# Patient Record
Sex: Male | Born: 1955
Health system: Southern US, Community
[De-identification: ages and names within clinical notes are randomized; demographics above are authoritative.]

## PROBLEM LIST (undated history)

## (undated) DIAGNOSIS — E0500 Thyrotoxicosis with diffuse goiter without thyrotoxic crisis or storm: Secondary | ICD-10-CM

## (undated) DIAGNOSIS — M25519 Pain in unspecified shoulder: Secondary | ICD-10-CM

## (undated) DIAGNOSIS — M722 Plantar fascial fibromatosis: Secondary | ICD-10-CM

## (undated) DIAGNOSIS — M1990 Unspecified osteoarthritis, unspecified site: Secondary | ICD-10-CM

## (undated) DIAGNOSIS — E039 Hypothyroidism, unspecified: Secondary | ICD-10-CM

## (undated) DIAGNOSIS — G4733 Obstructive sleep apnea (adult) (pediatric): Secondary | ICD-10-CM

## (undated) DIAGNOSIS — G4700 Insomnia, unspecified: Secondary | ICD-10-CM

## (undated) DIAGNOSIS — B351 Tinea unguium: Secondary | ICD-10-CM

## (undated) DIAGNOSIS — R7309 Other abnormal glucose: Secondary | ICD-10-CM

## (undated) DIAGNOSIS — J309 Allergic rhinitis, unspecified: Secondary | ICD-10-CM

## (undated) DIAGNOSIS — M549 Dorsalgia, unspecified: Secondary | ICD-10-CM

## (undated) HISTORY — PX: TONSILLECTOMY: SUR1361

## (undated) HISTORY — PX: KNEE ARTHROSCOPY: SUR90

## (undated) HISTORY — DX: Plantar fascial fibromatosis: M72.2

## (undated) HISTORY — PX: CYST REMOVAL LEG: SHX6280

## (undated) HISTORY — DX: Pain in unspecified shoulder: M25.519

## (undated) HISTORY — DX: Hypothyroidism, unspecified: E03.9

## (undated) HISTORY — DX: Other abnormal glucose: R73.09

## (undated) HISTORY — DX: Obstructive sleep apnea (adult) (pediatric): G47.33

## (undated) HISTORY — DX: Tinea unguium: B35.1

## (undated) HISTORY — DX: Allergic rhinitis, unspecified: J30.9

## (undated) HISTORY — DX: Dorsalgia, unspecified: M54.9

## (undated) HISTORY — DX: Insomnia, unspecified: G47.00

## (undated) HISTORY — DX: Thyrotoxicosis with diffuse goiter without thyrotoxic crisis or storm: E05.00

## (undated) HISTORY — PX: HIP ARTHROPLASTY: SHX981

---

## 2000-07-29 ENCOUNTER — Ambulatory Visit (HOSPITAL_COMMUNITY): Admission: RE | Admit: 2000-07-29 | Discharge: 2000-07-29 | Payer: Self-pay | Admitting: Gastroenterology

## 2007-02-17 ENCOUNTER — Encounter: Payer: Self-pay | Admitting: Internal Medicine

## 2007-05-16 ENCOUNTER — Encounter: Admission: RE | Admit: 2007-05-16 | Discharge: 2007-05-16 | Payer: Self-pay | Admitting: Family Medicine

## 2008-05-08 ENCOUNTER — Ambulatory Visit: Payer: Self-pay | Admitting: Internal Medicine

## 2008-05-08 DIAGNOSIS — E0500 Thyrotoxicosis with diffuse goiter without thyrotoxic crisis or storm: Secondary | ICD-10-CM | POA: Insufficient documentation

## 2008-05-08 DIAGNOSIS — B351 Tinea unguium: Secondary | ICD-10-CM | POA: Insufficient documentation

## 2008-05-08 DIAGNOSIS — M549 Dorsalgia, unspecified: Secondary | ICD-10-CM | POA: Insufficient documentation

## 2008-05-08 DIAGNOSIS — G4733 Obstructive sleep apnea (adult) (pediatric): Secondary | ICD-10-CM | POA: Insufficient documentation

## 2008-05-08 DIAGNOSIS — M722 Plantar fascial fibromatosis: Secondary | ICD-10-CM

## 2008-05-08 HISTORY — DX: Dorsalgia, unspecified: M54.9

## 2008-05-08 HISTORY — DX: Thyrotoxicosis with diffuse goiter without thyrotoxic crisis or storm: E05.00

## 2008-05-08 HISTORY — DX: Plantar fascial fibromatosis: M72.2

## 2008-05-08 HISTORY — DX: Obstructive sleep apnea (adult) (pediatric): G47.33

## 2008-05-08 HISTORY — DX: Tinea unguium: B35.1

## 2008-05-12 DIAGNOSIS — J309 Allergic rhinitis, unspecified: Secondary | ICD-10-CM

## 2008-05-12 HISTORY — DX: Allergic rhinitis, unspecified: J30.9

## 2008-05-29 ENCOUNTER — Ambulatory Visit: Payer: Self-pay | Admitting: Internal Medicine

## 2008-07-08 ENCOUNTER — Encounter: Payer: Self-pay | Admitting: Internal Medicine

## 2008-07-20 ENCOUNTER — Ambulatory Visit: Payer: Self-pay | Admitting: Internal Medicine

## 2009-01-21 ENCOUNTER — Telehealth (INDEPENDENT_AMBULATORY_CARE_PROVIDER_SITE_OTHER): Payer: Self-pay | Admitting: *Deleted

## 2009-04-22 ENCOUNTER — Telehealth: Payer: Self-pay | Admitting: *Deleted

## 2009-08-07 ENCOUNTER — Telehealth: Payer: Self-pay | Admitting: Family Medicine

## 2009-10-10 ENCOUNTER — Ambulatory Visit: Payer: Self-pay | Admitting: Family Medicine

## 2009-10-10 DIAGNOSIS — E039 Hypothyroidism, unspecified: Secondary | ICD-10-CM | POA: Insufficient documentation

## 2009-10-10 DIAGNOSIS — M25519 Pain in unspecified shoulder: Secondary | ICD-10-CM | POA: Insufficient documentation

## 2009-10-10 HISTORY — DX: Hypothyroidism, unspecified: E03.9

## 2009-10-10 HISTORY — DX: Pain in unspecified shoulder: M25.519

## 2009-10-14 LAB — CONVERTED CEMR LAB: TSH: 3.64 microintl units/mL (ref 0.35–5.50)

## 2009-10-31 ENCOUNTER — Ambulatory Visit: Payer: Self-pay | Admitting: Family Medicine

## 2010-04-16 ENCOUNTER — Ambulatory Visit: Payer: Self-pay | Admitting: Family Medicine

## 2010-04-16 LAB — CONVERTED CEMR LAB
ALT: 20 units/L (ref 0–53)
AST: 17 units/L (ref 0–37)
Albumin: 4.4 g/dL (ref 3.5–5.2)
Alkaline Phosphatase: 56 units/L (ref 39–117)
BUN: 15 mg/dL (ref 6–23)
Basophils Absolute: 0 10*3/uL (ref 0.0–0.1)
Basophils Relative: 0.5 % (ref 0.0–3.0)
Bilirubin Urine: NEGATIVE
Bilirubin, Direct: 0.2 mg/dL (ref 0.0–0.3)
Blood in Urine, dipstick: NEGATIVE
CO2: 30 meq/L (ref 19–32)
Calcium: 9 mg/dL (ref 8.4–10.5)
Chloride: 103 meq/L (ref 96–112)
Cholesterol: 160 mg/dL (ref 0–200)
Creatinine, Ser: 0.8 mg/dL (ref 0.4–1.5)
Eosinophils Absolute: 0.1 10*3/uL (ref 0.0–0.7)
Eosinophils Relative: 1.9 % (ref 0.0–5.0)
GFR calc non Af Amer: 116.21 mL/min (ref >60)
Glucose, Bld: 115 mg/dL — ABNORMAL HIGH (ref 70–99)
Glucose, Urine, Semiquant: NEGATIVE
HCT: 41.7 % (ref 39.0–52.0)
HDL: 41.9 mg/dL (ref >39.00)
Hemoglobin: 14.5 g/dL (ref 13.0–17.0)
Ketones, urine, test strip: NEGATIVE
LDL Cholesterol: 98 mg/dL (ref 0–99)
Lymphocytes Relative: 30.1 % (ref 12.0–46.0)
Lymphs Abs: 2.2 10*3/uL (ref 0.7–4.0)
MCHC: 34.7 g/dL (ref 30.0–36.0)
MCV: 90.2 fL (ref 78.0–100.0)
Monocytes Absolute: 0.6 10*3/uL (ref 0.1–1.0)
Monocytes Relative: 8.4 % (ref 3.0–12.0)
Neutro Abs: 4.3 10*3/uL (ref 1.4–7.7)
Neutrophils Relative %: 59.1 % (ref 43.0–77.0)
Nitrite: NEGATIVE
PSA: 0.21 ng/mL (ref 0.10–4.00)
Platelets: 282 10*3/uL (ref 150.0–400.0)
Potassium: 4.1 meq/L (ref 3.5–5.1)
Protein, U semiquant: NEGATIVE
RBC: 4.62 M/uL (ref 4.22–5.81)
RDW: 13.1 % (ref 11.5–14.6)
Sodium: 138 meq/L (ref 135–145)
Specific Gravity, Urine: 1.02
TSH: 2.21 microintl units/mL (ref 0.35–5.50)
Total Bilirubin: 0.9 mg/dL (ref 0.3–1.2)
Total CHOL/HDL Ratio: 4
Total Protein: 6.7 g/dL (ref 6.0–8.3)
Triglycerides: 100 mg/dL (ref 0.0–149.0)
Urobilinogen, UA: 0.2
VLDL: 20 mg/dL (ref 0.0–40.0)
WBC Urine, dipstick: NEGATIVE
WBC: 7.4 10*3/uL (ref 4.5–10.5)
pH: 7.5

## 2010-04-25 ENCOUNTER — Ambulatory Visit: Payer: Self-pay | Admitting: Family Medicine

## 2010-06-17 ENCOUNTER — Telehealth: Payer: Self-pay | Admitting: Family Medicine

## 2010-06-17 ENCOUNTER — Ambulatory Visit: Payer: Self-pay | Admitting: Family Medicine

## 2010-06-17 DIAGNOSIS — G4700 Insomnia, unspecified: Secondary | ICD-10-CM | POA: Insufficient documentation

## 2010-06-17 HISTORY — DX: Insomnia, unspecified: G47.00

## 2010-08-19 ENCOUNTER — Ambulatory Visit: Payer: Self-pay | Admitting: Family Medicine

## 2010-09-18 ENCOUNTER — Ambulatory Visit: Payer: Self-pay | Admitting: Family Medicine

## 2010-09-18 DIAGNOSIS — R7309 Other abnormal glucose: Secondary | ICD-10-CM

## 2010-09-18 HISTORY — DX: Other abnormal glucose: R73.09

## 2010-09-18 LAB — CONVERTED CEMR LAB: Blood Glucose, Fingerstick: 124

## 2010-11-06 NOTE — Assessment & Plan Note (Signed)
Summary: cpx//ccm   Vital Signs:  Patient profile:   55 year old male Height:      73.25 inches Weight:      283 pounds Temp:     98.7 degrees F oral Pulse rate:   60 / minute Pulse rhythm:   regular Resp:     12 per minute BP sitting:   110 / 72  (left arm) Cuff size:   large  Vitals Entered By: Nira Conn LPN (April 25, 3661 9:47 PM)  History of Present Illness: Here for complete physical examination  Patient has history of hypothyroidism and obstructive sleep apnea.  Past medical history, social history, and family history reviewed and no significant changes. He had colonoscopy at age 41 after some rectal bleeding. He thinks some polyps were noted but is not sure. Last tetanus unknown. No regular exercise.  Allergies (verified): No Known Drug Allergies  Past History:  Past Medical History: Last updated: 07/20/2008 chickenpox Allergic rhinitis Graves' disease I131 rx    on hypothyroid replacement obstructive sleep apnea -  NPSG @ [facility] 02/17/07 AHI 18.2, cpap to 9  Past Surgical History: Last updated: 10/10/2009  Tonsillectomy ( as a young boy)  Family History: Last updated: 05/08/2008 Raised in a foster family Family History of Stroke M 1st degree relative <50- Father Stroke age 57  Social History: Last updated: 05/08/2008 Married Former Smoker Alcohol use-yes Drug use-no Regular exercise-yes hh of 4   pet cockateel  6 hours sleep   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  Occupation:  Risk Factors: Alcohol Use: <1 (05/08/2008) Exercise: yes (05/08/2008)  Risk Factors: Smoking Status: quit > 6 months (10/10/2009) PMH-FH-SH reviewed for relevance  Review of Systems  The patient denies anorexia, fever, weight loss, weight gain, vision loss, decreased hearing, hoarseness, chest pain, syncope, dyspnea on exertion, peripheral edema, prolonged cough, headaches, hemoptysis, abdominal pain, melena, hematochezia, severe indigestion/heartburn, hematuria,  incontinence, genital sores, muscle weakness, suspicious skin lesions, transient blindness, difficulty walking, depression, unusual weight change, abnormal bleeding, enlarged lymph nodes, and testicular masses.         bilateral knee pain and stiffness which has gone on for years and somewhat progressive  Physical Exam  General:  Well-developed,well-nourished,in no acute distress; alert,appropriate and cooperative throughout examination Head:  Normocephalic and atraumatic without obvious abnormalities. No apparent alopecia or balding. Eyes:  pupils equal, pupils round, and pupils reactive to light.   Ears:  External ear exam shows no significant lesions or deformities.  Otoscopic examination reveals clear canals, tympanic membranes are intact bilaterally without bulging, retraction, inflammation or discharge. Hearing is grossly normal bilaterally. Mouth:  Oral mucosa and oropharynx without lesions or exudates.  Teeth in good repair. Neck:  No deformities, masses, or tenderness noted. Lungs:  Normal respiratory effort, chest expands symmetrically. Lungs are clear to auscultation, no crackles or wheezes. Heart:  Normal rate and regular rhythm. S1 and S2 normal without gallop, murmur, click, rub or other extra sounds. Abdomen:  Bowel sounds positive,abdomen soft and non-tender without masses, organomegaly or hernias noted. Rectal:  No external abnormalities noted. Normal sphincter tone. No rectal masses or tenderness. Prostate:  Prostate gland firm and smooth, no enlargement, nodularity, tenderness, mass, asymmetry or induration. Msk:  No deformity or scoliosis noted of thoracic or lumbar spine.   Extremities:  significant crepitus with flexion and extension of both knees. No knee effusion Neurologic:  alert & oriented X3, cranial nerves II-XII intact, strength normal in all extremities, and gait normal.   Skin:  Intact without suspicious lesions or rashes Cervical Nodes:  No lymphadenopathy  noted Psych:  Oriented X3, normally interactive, good eye contact, not anxious appearing, and not depressed appearing.     Impression & Recommendations:  Problem # 1:  Preventive Health Care (ICD-V70.0) Tdap given.  Labs reviewed and significant for glucose 115. Discussed diet and exercise at length. Needs to lose weight and establish more regular exercise. Dietary information given on reducing carbohydrate use. Recheck fasting blood sugar 3 months. Patient will check to see when next colonoscopy due  Complete Medication List: 1)  Synthroid 112 Mcg Tabs (Levothyroxine sodium) .Marland Kitchen.. 1 by mouth once daily 2)  Clonazepam 0.5 Mg Tabs (Clonazepam) .Marland Kitchen.. 1 before sleep as needed 3)  Cpap 9  4)  Cyclobenzaprine Hcl 10 Mg Tabs (Cyclobenzaprine hcl) .... One by mouth q 8 hours as needed muscle spasm 5)  Zyrtec Hives Relief 10 Mg Tabs (Cetirizine hcl) .... Once daily as needed  Other Orders: Tdap => 20yr IM (([id number] Admin 1st Vaccine ((60454  Patient Instructions: 1)  Please schedule a follow-up appointment in 3 months-for fasting glucose. 2)  It is important that you exercise reguarly at least 20 minutes 5 times a week. If you develop chest pain, have severe difficulty breathing, or feel very tired, stop exercising immediately and seek medical attention.  3)  You need to lose weight. Consider a lower calorie diet and regular exercise.  4)  Check to see who performed your last colonoscopy    Immunizations Administered:  Tetanus Vaccine:    Vaccine Type: Tdap    Site: left deltoid    Mfr: GlaxoSmithKline    Dose: 0.5 ml    Route: IM    Given by: NNira ConnLPN    Exp. Date: 10/24/2011    Lot #: [id number]

## 2010-11-06 NOTE — Assessment & Plan Note (Signed)
Summary: follow up/ mbw   PCP:  Tollie Pizza  Chief Complaint:  1 month followup.  Followup cpap titration study.  No new complaints.  Would like a flu vax.Marland Kitchen  History of Present Illness: From 05/29/08- 55 year old man with sleep apnea, coming to establish for help managing this condition.  diagnostic study done in the cardiology lab on 02/17/2007 recording an index of 18.2/hr.  CPAP was titrated to 9 CWP.  His wife tells him that it helps.  He still finds he only sleeps 5 hours maximum.  He fights daytime sleepiness.  Complains of busy brain at night.  He admits there is probably less daytime sleepiness, when he wears CPAP.  Weight has not changed. Bedtime.  11 p.m.Marland Kitchen  Sleep latency 30 to 60 minutes.  Waking 4 times during the night before he gets up at 5:30 a.m..  07/20/08- Autotitration confirms good control with cpap 9-10. Likes nasal pillows, less WASO. Liked clonazepam which lasts longer. Nose ok. Wife says he sleeps peacefully and qwuietly. Less EDS.      Current Allergies: No known allergies   Past Medical History:    Reviewed history from 05/29/2008 and no changes required:       chickenpox       Allergic rhinitis       Graves' disease I131 rx    on hypothyroid replacement       obstructive sleep apnea -  NPSG @ [facility] 02/17/07 AHI 18.2, cpap to 9  Past Surgical History:    Reviewed history from 05/29/2008 and no changes required:              Tonsillectomy   Social History:    Reviewed history from 05/08/2008 and no changes required:       Married       Former Smoker       Alcohol use-yes       Drug use-no       Regular exercise-yes       hh of 4         pet cockateel        6 hours sleep         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        Occupation:    Review of Systems      See HPI   Vital Signs:  Patient Profile:   55 Years Old Male Height:     73.5 inches Weight:      280 pounds O2 Sat:      99 % O2 treatment:    Room Air Pulse rate:   79 / minute BP  sitting:   116 / 78  Vitals Entered ByTilden Dome (July 20, 2008 2:53 PM)                 Physical Exam  General: A/Ox3; pleasant and cooperative, NAD, overweight, alert SKIN: no rash, lesions NODES: no lymphadenopathy HEENT: [place]/AT, EOM- WNL, Conjuctivae- clear, PERRLA, TM-WNL, Nose- clear, Throat- clear and wnl., Melampatti II-III NECK: Supple w/ fair ROM, JVD- none, normal carotid impulses w/o bruits Thyroid- normal to palpation CHEST: Clear to P&A HEART: RRR, no m/g/r heard ABDOMEN: Soft and nl; nml bowel sounds; no organomegaly or masses noted XBD:ZHGD, nl pulses, no edema  NEURO: Grossly intact to observation         Impression & Recommendations:  Problem # 1:  OBSTRUCTIVE SLEEP APNEA (ICD-327.23) Doing better. Reinforced sleep hygiene teaching, discussed clonazepam. Leave cpap at 9.  Flu vax.Cool bedroom.  Other Orders: Admin 1st Vaccine [phone]) Flu Vaccine 29yr + (([id number]   Patient Instructions: 1)  Please schedule a follow-up appointment in 6 months. 2)  Clonazepam script printed for occasional use as needed 3)  Flu vax 4)  I will look at the download when available and consider trying a pressure change if appropriate.   .Marland Kitchenpcflu  Flu Vaccine Consent Questions     Do you have a history of severe allergic reactions to this vaccine? no    Any prior history of allergic reactions to egg and/or gelatin? no    Do you have a sensitivity to the preservative Thimersol? no    Do you have a past history of Guillan-Barre Syndrome? no    Do you currently have an acute febrile illness? no    Have you ever had a severe reaction to latex? no    Vaccine information given and explained to patient? yes    Are you currently pregnant? no    Lot Number:[id number]   Site Given  Left Deltoid IM JParke PoissonCNA  July 20, 2008 3:31 PM  Prescriptions: CLONAZEPAM 0.5 MG TABS (CLONAZEPAM) 1 before sleep as needed  #30 x prn   Entered and Authorized by:   CDeneise LeverMD   Signed by:   CDeneise LeverMD on 07/20/2008   Method used:   Print then Give to Patient   RxID:   [id number] ]

## 2010-11-06 NOTE — Progress Notes (Signed)
Summary: Clonazepam refill  Phone Note Call from Patient Call back at Home Phone [phone]   Caller: Spouse Call For: young Summary of Call: requests rx for clonazepam (expired). [facility] [place]  Initial call taken by: Cooper Render,  January 21, 2009 12:06 PM  Follow-up for Phone Call        Please advise if OK to refill. Thanks. Freddrick March RN  January 21, 2009 12:18 PM   Additional Follow-up for Phone Call Additional follow up Details #1::        OK to refill x 5 months, Thanks. Additional Follow-up by: Deneise Lever MD,  January 21, 2009 1:53 PM      Prescriptions: CLONAZEPAM 0.5 MG TABS (CLONAZEPAM) 1 before sleep as needed  #30 x 5   Entered by:   Freddrick March RN   Authorized by:   Deneise Lever MD   Signed by:   Freddrick March RN on 01/21/2009   Method used:   Telephoned to ...       [facility] (retail)       [address]       [place], [place]  [address]       Ph: [phone] or [phone]       Fax: [phone]   RxID:   [id number]

## 2010-11-06 NOTE — Assessment & Plan Note (Signed)
Summary: to est- ok per Dr Jolaine Fryberger//ccm   Vital Signs:  Patient profile:   55 year old male Height:      73.25 inches Weight:      281 pounds BMI:     36.95 Temp:     98.8 degrees F oral Pulse rate:   80 / minute Pulse rhythm:   regular Resp:     12 per minute BP sitting:   150 / 82  (left arm) Cuff size:   large  Vitals Entered By: Nira Conn LPN (October 10, 812 3:07 PM) CC: To establish with Dr Elease Hashimoto   History of Present Illness: Patient transferring care to me. I had seen him several years ago at prior practice. Today for the following items.  Hypothyroidism and needs blood work. Takes Synthroid 112 micrograms daily.  Compliant with therapy.  Several weeks of cystic swelling left upper eyelid. No drainage. No pain. No visual symptoms. Use warm compresses but only a couple of times.  Bilateral shoulder pain left greater than right. Right-hand dominant. No injuries. Saw orthopedist in October and injections helped slightly. Thermal pad helps slightly. Pain with abduction and internal rotation. Queries whether physical therapy will help.  No hx of x-rays.  Preventive Screening-Counseling & Management  Alcohol-Tobacco     Smoking Status: quit > 6 months  Allergies (verified): No Known Drug Allergies  Past History:  Past Medical History: Last updated: 07/20/2008 chickenpox Allergic rhinitis Graves' disease I131 rx    on hypothyroid replacement obstructive sleep apnea -  NPSG @ [facility] 02/17/07 AHI 18.2, cpap to 9  Family History: Last updated: 05/08/2008 Raised in a foster family Family History of Stroke M 1st degree relative <50- Father Stroke age 73  Social History: Last updated: 05/08/2008 Married Former Smoker Alcohol use-yes Drug use-no Regular exercise-yes hh of 4   pet cockateel  6 hours sleep   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         Flu Vaccine Consent Questions     Do you have a history of severe allergic reactions to this vaccine? no    Any prior history of allergic reactions to egg and/or gelatin? no    Do you have a sensitivity to the preservative Thimersol? no    Do you have a past history of Guillan-Barre Syndrome? no    Do you currently have an acute febrile illness? no    Have you ever had a severe reaction to latex? no    Vaccine information given and explained to patient? yes    Are you currently pregnant? no    Lot Number:[id number]   Exp Date:04/03/2010   Site Given  right Deltoid IM Irish Elders CMA  October 10, 2009 3:57 PM   Physical Exam  General:  Well-developed,well-nourished,in no acute distress; alert,appropriate and cooperative throughout examination Head:  Normocephalic and atraumatic without obvious abnormalities. No apparent alopecia or balding. Eyes:  cystic, moveable, nontender mass L upper lid about one half  cm in size.pupils equal, pupils round, pupils reactive to light, corneas and lenses clear, and no injection.   Ears:  External ear exam shows no significant lesions or deformities.  Otoscopic examination reveals clear  canals, tympanic membranes are intact bilaterally without bulging, retraction, inflammation or discharge. Hearing is grossly normal bilaterally. Mouth:  Oral mucosa and oropharynx without lesions or exudates.  Teeth in good repair. Neck:  No deformities, masses, or tenderness noted. Lungs:  Normal respiratory effort, chest expands symmetrically. Lungs are clear to auscultation, no crackles or wheezes. Heart:  Normal rate and regular rhythm. S1 and S2 normal without gallop, murmur, click, rub or other extra sounds. Extremities:  Bil shoulder pain  with abduction and internal rotation.  No AC joint tenderness.  No biceps tenderness. Neurologic:  strength normal in all extremities and DTRs symmetrical and normal.     Impression & Recommendations:  Problem # 1:  HYPOTHYROIDISM (ICD-244.9) recheck labs. His updated medication list for this problem includes:    Synthroid 112 Mcg Tabs (Levothyroxine sodium) .Marland Kitchen... 1 by mouth once daily  Orders: TLB-TSH (Thyroid Stimulating Hormone) (84443-TSH) Venipuncture (21587)  Problem # 2:  SHOULDER PAIN, BILATERAL (ICD-719.41) Assessment: New Suspect rotator cuff tendinitis. Patient requests trial of physical therapy which seems reasonable. If no improvement with that consider either repeat corticosteroid injection or MRI to further assess  Problem # 3:  CHALAZION, LEFT (ICD-373.2) Assessment: New warm compresses several times daily. Ophthalmology referral if no better 2-3 weeks.  Complete Medication List: 1)  Synthroid 112 Mcg Tabs (Levothyroxine sodium) .Marland Kitchen.. 1 by mouth once daily 2)  Clonazepam 0.5 Mg Tabs (Clonazepam) .Marland Kitchen.. 1 before sleep as needed 3)  Cpap 9   Other Orders: Admin 1st Vaccine (27618) Flu Vaccine 64yr + ((48592  Patient Instructions: 1)  Use warm compresses to left upper lid several times daily. Call if this is not resolving in the next few weeks. 2)  Call regarding physical therapist and we will get this set up.   Orders Added: 1)  Est. Patient Level IV [[76394]2)  TLB-TSH (Thyroid Stimulating Hormone) [84443-TSH] 3)  Venipuncture [[32003]4)  Admin 1st Vaccine [90471] 5)  Flu Vaccine 332yr+ [9[79444]

## 2010-11-06 NOTE — Assessment & Plan Note (Signed)
Summary: sleep apnea/apc   Visit Type:  IME Initial PCP:  Tollie Pizza  Chief Complaint:  sleep consult.  History of Present Illness: Current Problems:  DERMATOPHYTOSIS OF NAIL (ICD-110.1) PLANTAR FASCIITIS (ICD-728.71) BACK PAIN (ICD-724.5) ALLERGIC RHINITIS (ICD-477.9) OBSTRUCTIVE SLEEP APNEA (ICD-327.23) GRAVES DISEASE (ICD-10.65)  55 year old man with sleep apnea, coming to establish for help managing this condition.  diagnostic study done in the cardiology lab on 02/17/2007 recording.  An index of 18.2/hr.  CPAP was titrated to 9 CWP.  His wife tells him that it helps.  He still finds he only sleeps 5 hours maximum.  He fights, daytime sleepiness.  Complains of busy brain at night.  He admits there is probably less daytime sleepiness, when he wears CPAP.  Weight has not changed. Bedtime.  11 p.m.Marland Kitchen  Sleep latency 30 to 60 minutes.  Waking 4 times during the night before he gets up at 5:30 a.m.. Has had tonsillectomy.  Allergic rhinitis.  Graves' disease with thyroid hormone being actively managed.  8 cups of coffee in the morning.  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          Prior Medication List:  SYNTHROID 112 MCG  TABS (LEVOTHYROXINE SODIUM) 1 by mouth once daily   Updated Prior Medication List: SYNTHROID 112 MCG  TABS (LEVOTHYROXINE SODIUM) 1 by mouth once daily  Current Allergies (reviewed today): No known allergies   Past Medical History:    Reviewed history from 05/08/2008 and no changes required:       chickenpox       Allergic rhinitis       Graves' disease I131 rx    on hypothyroid replacement       obstructive sleep apnea NPSG @ [facility] 02/17/07 AHI 18.2, cpap to 9  Past Surgical History:    Reviewed history from 05/08/2008 and no changes required:              Tonsillectomy   Family History:    Reviewed history from 05/08/2008 and no changes required:       Raised in a foster family       Family History of Stroke M 1st degree  relative <50- Father Stroke age 62  Social History:    Reviewed history from 05/08/2008 and no changes required:       Married       Former Smoker       Alcohol use-yes       Drug use-no       Regular exercise-yes       hh of 4         pet cockateel        6 hours sleep         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  Daytime sleepiness, nonrestorative sleep.  Denies headache, syncope, palpitation, chest pain, difficulty swallowing, shortness of breath.   Vital Signs:  Patient Profile:   55 Years Old Male Height:     73.5 inches Weight:      278.38 pounds O2 Sat:      97 % O2 treatment:    Room Air Pulse rate:   75 / minute BP sitting:   116 / 74  (left arm) Cuff size:   large  Vitals Entered By: Clayborne Dana CMA (May 29, 2008 2:43 PM)  Comments Medications reviewed with patient Clayborne Dana CMA  May 29, 2008 2:43 PM      Physical Exam  General: A/Ox3; pleasant and cooperative, NAD, large man, alert, mildly overweight SKIN: no rash, lesions NODES: no lymphadenopathy HEENT: [place]/AT, EOM- WNL, Conjuctivae- clear, PERRLA, TM-WNL, Nose- clear, Throat- clear and wnl, Melampatti III NECK: Supple w/ fair ROM, JVD- none, normal carotid impulses w/o bruits Thyroid- normal to palpation CHEST: Clear to P&A HEART: RRR, no m/g/r heard ABDOMEN: Soft and nl; nml bowel sounds; no organomegaly or masses noted AQV:OHCS, nl pulses, no edema  NEURO: Grossly intact to observation         Impression & Recommendations:  Problem # 1:  OBSTRUCTIVE SLEEP APNEA (ICD-327.23) Obstructive sleep apnea.  He complains of insomnia, and daytime sleepiness.  We will first determine whether the CPAP is inadequate by auto titrating for pressure check.  We can also attempt to improve nighttime sleep with clonazepam.  I have discussed this medication, some alternatives, and sleep  hygiene.  Medications Added to Medication List This Visit: 1)  Clonazepam 0.5 Mg Tabs (Clonazepam) .Marland Kitchen.. 1 before sleep as needed 2)  Cpap 9    Patient Instructions: 1)  Please schedule a follow-up appointment in 1 month. 2)  See [facility] to discuss CPAP titration. 3)  Try clonazepam as needed before sleep   Prescriptions: CLONAZEPAM 0.5 MG TABS (CLONAZEPAM) 1 before sleep as needed  #20 x 0   Entered and Authorized by:   Deneise Lever MD   Signed by:   Deneise Lever MD on 05/29/2008   Method used:   Print then Give to Patient   RxID:   [id number]  ]

## 2010-11-06 NOTE — Assessment & Plan Note (Signed)
Summary: Follow up/cb   Vital Signs:  Patient profile:   55 year old male Weight:      281 pounds Temp:     98.5 degrees F oral BP sitting:   110 / 80  (left arm) Cuff size:   large  Vitals Entered By: Nira Conn LPN (September 19, 4195 4:35 PM) CBG Result 124   History of Present Illness: Here for the following:  Prediabetes by labs with CPE last year.  No symptoms of hyperglycemia. Not exercising.  Ate 5 hours ago. FH unknown as he is adopted.  No weight changes since CPE last year.  Cough for 2 weeks and gradually improving. No fever.  Nonsmoker.  No wheezing or hemoptysis.  Allergies (verified): No Known Drug Allergies  Past History:  Past Medical History: Last updated: 07/20/2008 chickenpox Allergic rhinitis Graves' disease I131 rx    on hypothyroid replacement obstructive sleep apnea -  NPSG @ [facility] 02/17/07 AHI 18.2, cpap to 9  Past Surgical History: Last updated: 10/10/2009  Tonsillectomy ( as a young boy)  Family History: Last updated: 05/08/2008 Raised in a foster family Family History of Stroke M 1st degree relative <50- Father Stroke age 57  Social History: Last updated: 05/08/2008 Married Former Smoker Alcohol use-yes Drug use-no Regular exercise-yes hh of 4   pet cockateel  6 hours sleep   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 EOMI. Perrla. Funduscopic exam benign, without hemorrhages, exudates or papilledema. Vision grossly normal. Mouth:  Oral mucosa and oropharynx without lesions or exudates.  Teeth in good repair. Neck:  No  deformities, masses, or tenderness noted. Lungs:  Normal respiratory effort, chest expands symmetrically. Lungs are clear to auscultation, no crackles or wheezes. Heart:  Normal rate and regular rhythm. S1 and S2 normal without gallop, murmur, click, rub or other extra sounds. Extremities:  no edema.   Impression & Recommendations:  Problem # 1:  PREDIABETES (ICD-790.29)  discussed diet and exercise role in preventing Type 2 DM. Encouragement offerred.  Orders: Glucose, (CBG) (22297)  Problem # 2:  ACUTE BRONCHITIS (ICD-466.0) suspect viral  Complete Medication List: 1)  Synthroid 112 Mcg Tabs (Levothyroxine sodium) .Marland Kitchen.. 1 by mouth once daily 2)  Clonazepam 0.5 Mg Tabs (Clonazepam) .Marland Kitchen.. 1 before sleep as needed 3)  Cpap 9  4)  Cyclobenzaprine Hcl 10 Mg Tabs (Cyclobenzaprine hcl) .... One by mouth q 8 hours as needed muscle spasm 5)  Zyrtec Hives Relief 10 Mg Tabs (Cetirizine hcl) .... Once daily as needed Prescriptions: SYNTHROID 112 MCG  TABS (LEVOTHYROXINE SODIUM) 1 by mouth once daily  #90 x 3   Entered and Authorized by:   Carolann Littler MD   Signed by:   Carolann Littler MD on 09/18/2010   Method used:   Electronically to        [facility] (retail)       [address].       [place], [place]  [address]       Ph: [phone] or [phone]       Fax: [phone]   RxID:  [id number]    Orders Added: 1)  Est. Patient Level III [83779] 2)  Glucose, (CBG) [82962]

## 2010-11-06 NOTE — Progress Notes (Signed)
Summary: NEED SAMPLES  Phone Note Call from Patient Call back at Home Phone [phone]   Caller: Patient-LIVE CALL Summary of Call: [facility]. Initial call taken by: Despina Arias,  April 22, 2009 10:08 AM  Follow-up for Phone Call        LMTOCB-No samples in the bulding. But we can call in a rx if he would like. Follow-up by: Sherron Monday, [place],  April 22, 2009 2:41 PM  Additional Follow-up for Phone Call Additional follow up Details #1::        Pt aware and will call back to let us know how many tabs he wants [place] to call in. Additional Follow-up by: Sherron Monday, [place],  April 23, 2009 11:09 AM    Additional Follow-up for Phone Call Additional follow up Details #2::    Pt called back saying that he needs [place] to call in 14 days worth of medication to [facility]. Rx sent electronically.  Follow-up by: Sherron Monday, CMA,  April 24, 2009 12:58 PM  Prescriptions: SYNTHROID 112 MCG  TABS (LEVOTHYROXINE SODIUM) 1 by mouth once daily  #14 x 0   Entered by:   Sherron Monday, CMA   Authorized by:   Burnis Medin MD   Signed by:   Sherron Monday, CMA on 04/24/2009   Method used:   Electronically to        [facility] (retail)       [address]       [place], [place]  [address]       Ph: [phone]       Fax: [phone]   RxID:   [id number]

## 2010-11-06 NOTE — Progress Notes (Signed)
Summary: tick bite  Phone Note Call from Patient Call back at Work Phone [phone]   Caller: Patient Call For: Carolann Littler MD Summary of Call: Pt has tick bite that has become red and inflamed.  Left message for pt to call back for ov. Initial call taken by: Deanna Artis CMA,  June 17, 2010 1:08 PM  Follow-up for Phone Call        Appt scheduled. Follow-up by: Deanna Artis CMA,  June 17, 2010 3:06 PM

## 2010-11-06 NOTE — Progress Notes (Signed)
Summary: Synthroid to [facility]  Phone Note Call from Patient   Caller: Spouse Call For: Burnis Medin MD Summary of Call: Pt's wife calls, and needs husband's Synthroid .112 micrograms sent to [facility]. [phone] Initial call taken by: Deanna Artis CMA,  August 07, 2009 10:58 AM  Follow-up for Phone Call        Spoke to pt's wife and he wants to transfer Dr. Elease Hashimoto because he saw him before at [facility]. Is this okay? He will still need a refill until he see's someone.  Per Dr. Fuller Mandril to transfer back to Dr. Elease Hashimoto. Follow-up by: Sherron Monday, CMA (AAMA),  August 07, 2009 11:18 AM  Additional Follow-up for Phone Call Additional follow up Details #1::        Synthroid Rx sent to [facility], wife informed he may transfer to Dr Elease Hashimoto.  She will go to [facility] today to transfer records.  Wife would like to know when his last labs were don as he probably needs repeat to check TSH.  We will await his records and inform. Additional Follow-up by: Nira Conn LPN,  August 08, 3015 12:02 PM    Prescriptions: SYNTHROID 112 MCG  TABS (LEVOTHYROXINE SODIUM) 1 by mouth once daily  #90 x 1   Entered by:   Nira Conn LPN   Authorized by:   Carolann Littler MD   Signed by:   Nira Conn LPN on 10/13/3233   Method used:   Electronically to        [facility] (mail-order)             ,          Ph: [phone]       Fax: [phone]   RxID:   [id number]

## 2010-11-06 NOTE — Assessment & Plan Note (Signed)
Summary: LOWER BACK PAIN // RS   Vital Signs:  Patient profile:   55 year old male Weight:      283 pounds Temp:     98.6 degrees F oral BP sitting:   120 / 80  (left arm) Cuff size:   large  Vitals Entered By: Nira Conn LPN (November 01, 971 3:59 PM) CC: Low back pain X 2 days   History of Present Illness: Low back pain. Started Tuesday after reaching over to pick up a computer. Right side greater than left. No radiculopathy symptoms. History of similar pains in the past. Has used Aleve with mild relief. Cyclobenzaprine has helped in the past. Sensation of muscle stiffening frequently. Denies any numbness or weakness and no loss of bladder or bowel control.  Allergies (verified): No Known Drug Allergies  Past History:  Past Medical History: Last updated: 07/20/2008 chickenpox Allergic rhinitis Graves' disease I131 rx    on hypothyroid replacement obstructive sleep apnea -  NPSG @ [facility] 02/17/07 AHI 18.2, cpap to 9  Review of Systems      See HPI  Physical Exam  General:  Well-developed,well-nourished,in no acute distress; alert,appropriate and cooperative throughout examination Lungs:  Normal respiratory effort, chest expands symmetrically. Lungs are clear to auscultation, no crackles or wheezes. Heart:  Normal rate and regular rhythm. S1 and S2 normal without gallop, murmur, click, rub or other extra sounds. Msk:  minimal tenderness right and left lower lumbar region. Able to flex back 90 and extend 10-15. Straight leg raise is negative bilaterally. Neurologic:  lower extrem reflexes symmetric. No strength deficits lower extremities. Normal sensory function   Impression & Recommendations:  Problem # 1:  BACK PAIN (ICD-724.5) Cont alleve and add flexeril as needed. His updated medication list for this problem includes:    Cyclobenzaprine Hcl 10 Mg Tabs (Cyclobenzaprine hcl) ..... One by mouth q 8 hours as needed muscle spasm  Complete Medication List: 1)   Synthroid 112 Mcg Tabs (Levothyroxine sodium) .Marland Kitchen.. 1 by mouth once daily 2)  Clonazepam 0.5 Mg Tabs (Clonazepam) .Marland Kitchen.. 1 before sleep as needed 3)  Cpap 9  4)  Cyclobenzaprine Hcl 10 Mg Tabs (Cyclobenzaprine hcl) .... One by mouth q 8 hours as needed muscle spasm  Patient Instructions: 1)  Most patients (90%) with low back pain will improve with time ( 2-6 weeks). Keep active but avoid activities that are painful. Apply moist heat and/or ice to lower back several times a day.  Prescriptions: CYCLOBENZAPRINE HCL 10 MG TABS (CYCLOBENZAPRINE HCL) one by mouth q 8 hours as needed muscle spasm  #30 x 2   Entered and Authorized by:   Carolann Littler MD   Signed by:   Carolann Littler MD on 10/31/2009   Method used:   Electronically to        [facility] (retail)       [address]       [place], [place]  [address]       Ph: [phone]       Fax: [phone]   RxID:   [id number]

## 2010-11-06 NOTE — Assessment & Plan Note (Signed)
Summary: TO BE EST/NJR   Vital Signs:  Patient Profile:   55 Years Old Male Height:     73.5 inches Weight:      280 pounds BMI:     36.57 Pulse rate:   66 / minute BP sitting:   120 / 74  (left arm) Cuff size:   large  Vitals Entered By: Sherron Monday, CMA (May 08, 2008 10:51 AM)                 Chief Complaint:  New Patient.  History of Present Illness: John Frye is here for a new patient to get establish.   last PCP  [facility]   for back problem        LAST Td: within 10 years Colonscopy: 4 years ago 2004  EKG: within a year.   Smoking: former Back had mri  and ? djd.     and to do therapy.    Thyroid    12 years     graves disease I131   Dr Forde Dandy  in past .      on replacement .   aadherence ok but missed last 2 day.   Last tsh was  within 6-12 month.   No cholesterol  .    Season allergies  spring     uses otc.   no asthma.   OSA  on cpap.     on x 6 months .    rx by  pcp.   better   Plantar fasciitis .   chronic    with toenail fungus.    for years.       Prior Medications Reviewed Using: Patient Recall  Updated Prior Medication List: SYNTHROID 112 MCG  TABS (LEVOTHYROXINE SODIUM) 1 by mouth once daily  Current Allergies (reviewed today): No known allergies   Past Medical History:    chickenpox    Allergic rhinitis    Graves' disease I131 rx    on hypothyroid replacement    obstructive sleep apnea  Past Surgical History:    Iodine done for thyroid    Tonsillectomy   Family History:    Raised in a foster family    Family History of Stroke M 1st degree relative <50- Father Stroke age 32  Social History:    Married    Former Smoker    Alcohol use-yes    Drug use-no    Regular exercise-yes    hh of 4      pet cockateel     6 hours sleep      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         in the past blood in the stool  had sigmoid and colonscopy.   2004   exercise tolerance  slowing over the years .   weight up over the last year.     Physical Exam  General:     alert, well-developed, well-nourished, and  well-hydrated.   Head:     normocephalic, atraumatic, and no abnormalities observed.   Eyes:     vision grossly intact, pupils equal, and pupils round.   Ears:     R ear normal, L ear normal, and no external deformities.   Nose:     no external deformity and no nasal discharge.   Mouth:     pharynx pink and moist, no erythema, and no exudates.   Neck:     No deformities, masses, or tenderness noted. Lungs:     Normal respiratory effort, chest expands symmetrically. Lungs are clear to auscultation, no crackles or wheezes.no dullness.   Heart:     Normal rate and regular rhythm. S1 and S2 normal without gallop, murmur, click, rub or other extra sounds.no lifts.   Abdomen:     Bowel sounds positive,abdomen soft and non-tender without masses, organomegaly or hernias noted. Msk:     no atrophy  no joint swelling, no joint warmth, and no redness over joints.   Pulses:     R and L carotid,radial,femoral,dorsalis pedis and posterior tibial pulses are full and equal bilaterally Extremities:     no clubbing cyanosis or edema Neurologic:     alert & oriented X3, cranial nerves II3XII intact, and gait normal.   Skin:     turgor normal, color normal, no ecchymoses, and no petechiae.   Axillary Nodes:     no R axillary adenopathy and no L axillary adenopathy.   Psych:     normally interactive, good eye contact, and not anxious appearing.      Impression &  Recommendations:  Problem # 1:  BACK PAIN (ICD-724.5) previous evaluation intervention planned.  Need more information from records  Problem # 2:  GRAVES DISEASE (ICD-242.00) status post treatment on thyroid replacement  Problem # 3:  OBSTRUCTIVE SLEEP APNEA (ICD-327.23) has not been under the care of his specialist and it is unclear if he is an optimal setting.  This would affect compliance also. Orders: Pulmonary Referral (Pulmonary)   Problem # 4:  PLANTAR FASCIITIS (ICD-728.71) discussion  Problem # 5:  DERMATOPHYTOSIS OF NAIL (ICD-110.1) Assessment: Comment Only  Complete Medication List: 1)  Synthroid 112 Mcg Tabs (Levothyroxine sodium) .Marland Kitchen.. 1 by mouth once daily   Patient Instructions: 1)  will call you about sleep referral 2)  get copy of sleep study.   in the meantime. 3)  get copy of last lab work and mri     sign a release for most recent  labs and mri report   4)  we can review and schedule follow up .   ]

## 2010-11-06 NOTE — Assessment & Plan Note (Signed)
Summary: inflamed tick bite/dm   Vital Signs:  Patient profile:   55 year old male Weight:      283 pounds O2 Sat:      95 % Temp:     98.6 degrees F Pulse rate:   76 / minute BP sitting:   114 / 76  (left arm) Cuff size:   large  Vitals Entered By: Levora Angel, RN (June 17, 2010 4:39 PM) CC: pulled tick from rt ankle saturday 06-14-10 red area without rash. refill on clonazapeam and cyclobenazaprine.   History of Present Illness: Here for an irritated bite on the right ankle. He was hiking at [employer] last weekend, and he saw a tiny tick on ther right ankle. He pulled this off carefully with tweezers. Now over the past 2 days it has become red and a bit tender. No fevers or rashes or body aches. He also needs refills on some meds as below. He had a cpx with Dr. Elease Hashimoto recently.   Allergies (verified): No Known Drug Allergies  Past History:  Past Medical History: Reviewed history from 07/20/2008 and no changes required. chickenpox Allergic rhinitis Graves' disease I131 rx    on hypothyroid replacement obstructive sleep apnea -  NPSG @ [facility] 02/17/07 AHI 18.2, cpap to 9  Review of Systems  The patient denies anorexia, fever, weight loss, weight gain, vision loss, decreased hearing, hoarseness, chest pain, syncope, dyspnea on exertion, peripheral edema, prolonged cough, headaches, hemoptysis, abdominal pain, melena, hematochezia, severe indigestion/heartburn, hematuria, incontinence, genital sores, muscle weakness, suspicious skin lesions, transient blindness, difficulty walking, depression, unusual weight change, abnormal bleeding, enlarged lymph nodes, angioedema, breast masses, and testicular masses.    Physical Exam  General:  Well-developed,well-nourished,in no acute distress; alert,appropriate and cooperative throughout examination Skin:  the medial right ankle has a 0.5 cm red tender nodule Psych:  Cognition and judgment appear intact. Alert and cooperative  with normal attention span and concentration. No apparent delusions, illusions, hallucinations   Impression & Recommendations:  Problem # 1:  TICK BITE (ICD-E906.4)  Problem # 2:  BACK PAIN (ICD-724.5)  His updated medication list for this problem includes:    Cyclobenzaprine Hcl 10 Mg Tabs (Cyclobenzaprine hcl) ..... One by mouth q 8 hours as needed muscle spasm  Problem # 3:  INSOMNIA (ICD-780.52)  Complete Medication List: 1)  Synthroid 112 Mcg Tabs (Levothyroxine sodium) .Marland Kitchen.. 1 by mouth once daily 2)  Clonazepam 0.5 Mg Tabs (Clonazepam) .Marland Kitchen.. 1 before sleep as needed 3)  Cpap 9  4)  Cyclobenzaprine Hcl 10 Mg Tabs (Cyclobenzaprine hcl) .... One by mouth q 8 hours as needed muscle spasm 5)  Zyrtec Hives Relief 10 Mg Tabs (Cetirizine hcl) .... Once daily as needed 6)  Doxycycline Hyclate 100 Mg Caps (Doxycycline hyclate) .... Two times a day  Patient Instructions: 1)  Cover with Doxycycline for 10 days. Refilled his meds as below.  2)  Please schedule a follow-up appointment as needed .  Prescriptions: DOXYCYCLINE HYCLATE 100 MG CAPS (DOXYCYCLINE HYCLATE) two times a day  #20 x 0   Entered and Authorized by:   Laurey Morale MD   Signed by:   Laurey Morale MD on 06/17/2010   Method used:   Print then Give to Patient   RxID:   [id number] CYCLOBENZAPRINE HCL 10 MG TABS (CYCLOBENZAPRINE HCL) one by mouth q 8 hours as needed muscle spasm  #60 x 2   Entered and Authorized by:   Laurey Morale MD  Signed by:   Laurey Morale MD on 06/17/2010   Method used:   Print then Give to Patient   RxID:   [id number] CLONAZEPAM 0.5 MG TABS (CLONAZEPAM) 1 before sleep as needed  #30 x 2   Entered and Authorized by:   Laurey Morale MD   Signed by:   Laurey Morale MD on 06/17/2010   Method used:   Print then Give to Patient   RxID:   [id number]

## 2010-11-06 NOTE — Assessment & Plan Note (Signed)
Summary: flu shot/cb  Nurse Visit    Allergies: No Known Drug Allergies  Orders Added: 1)  Admin 1st Vaccine [90471] 2)  Flu Vaccine 9yr + [[45038]        Flu Vaccine Consent Questions     Do you have a history of severe allergic reactions to this vaccine? no    Any prior history of allergic reactions to egg and/or gelatin? no    Do you have a sensitivity to the preservative Thimersol? no    Do you have a past history of Guillan-Barre Syndrome? no    Do you currently have an acute febrile illness? no    Have you ever had a severe reaction to latex? no    Vaccine information given and explained to patient? yes    Are you currently pregnant? no    Lot Number:[id number]   Exp Date:04/04/2011   Site Given  Left Deltoid IM

## 2011-02-20 NOTE — Procedures (Signed)
[facility]  Patient:    John Frye, John Frye                     MRN: [id number] Proc. Date: 07/29/00 Attending:  Mickeal Skinner, M.D. CC:         Wenda Low, M.D., [facility]   Procedure Report  PROCEDURE:  Colonoscopy.  ENDOSCOPIST:  Mickeal Skinner, M.D.  REFERRING PHYSICIAN:  Wenda Low, M.D.  INDICATIONS FOR PROCEDURE:  The patient is a 55 year old male referred by Dr. Wenda Low for diagnostic colonoscopy to evaluate intermittent painless hematochezia.  The patient viewed our colonoscopy education film.  I discussed with him the complication associated with colonoscopy and polypectomy including intestinal bleeding and intestinal perforation.  The patient has signed the operative permit.  MEDICATION ALLERGIES:  NONE.  PAST MEDICAL HISTORY:  Allergic rhinitis, Graves disease treated by radioactive iodine, radiculopathy.  FAMILY HISTORY:  Negative for colorectal cancer.  PREMEDICATION:  Versed 8.5 mg and Demerol 50 mg.  ENDOSCOPE:  Olympus pediatric colonoscope.  DESCRIPTION OF PROCEDURE:  After obtaining informed consent, the patient was placed in the left lateral decubitus position.  I administered intravenous Demerol and intravenous Versed to achieve conscious sedation for the procedure.  The patients blood pressure, oxygen saturation, and cardiac rhythm were monitored throughout the procedure and documented in the medical record.  Anal inspection was normal.  Digital rectal exam was normal.  The Olympus pediatric video colonoscope was introduced into the rectum and under direct vision advanced to the cecum as identified by a normal-appearing ileocecal valve.  The ileocecal valve was easily intubated and the distal ileum inspected.  Colonic preparation for the exam today was excellent.  Rectum normal.  Sigmoid colon and descending colon normal.  Splenic flexure normal.  Transverse colon  normal.  Hepatic flexure normal.  Ascending colon normal.  Cecum and ileocecal valve normal.  Distal ileum normal.  ASSESSMENT:  Normal proctocolonoscopy to the cecum with intubation of the ileocecal valve and distal ileum inspection. DD:  07/29/00 TD:  07/29/00 Job: [id number]

## 2011-03-15 ENCOUNTER — Other Ambulatory Visit: Payer: Self-pay | Admitting: Family Medicine

## 2011-05-22 ENCOUNTER — Other Ambulatory Visit: Payer: Self-pay | Admitting: Family Medicine

## 2011-08-20 ENCOUNTER — Other Ambulatory Visit: Payer: Self-pay | Admitting: Family Medicine

## 2011-09-17 ENCOUNTER — Encounter: Payer: Self-pay | Admitting: Family Medicine

## 2011-09-17 ENCOUNTER — Ambulatory Visit (INDEPENDENT_AMBULATORY_CARE_PROVIDER_SITE_OTHER): Payer: 59 | Admitting: Family Medicine

## 2011-09-17 DIAGNOSIS — R1032 Left lower quadrant pain: Secondary | ICD-10-CM

## 2011-09-17 DIAGNOSIS — M549 Dorsalgia, unspecified: Secondary | ICD-10-CM

## 2011-09-17 DIAGNOSIS — E039 Hypothyroidism, unspecified: Secondary | ICD-10-CM

## 2011-09-17 LAB — CBC WITH DIFFERENTIAL/PLATELET
Basophils Absolute: 0 10*3/uL (ref 0.0–0.1)
Basophils Relative: 0.5 % (ref 0.0–3.0)
Eosinophils Absolute: 0.1 10*3/uL (ref 0.0–0.7)
Eosinophils Relative: 1.1 % (ref 0.0–5.0)
HCT: 43.6 % (ref 39.0–52.0)
Hemoglobin: 14.9 g/dL (ref 13.0–17.0)
Lymphocytes Relative: 30.9 % (ref 12.0–46.0)
Lymphs Abs: 2.2 10*3/uL (ref 0.7–4.0)
MCHC: 34.3 g/dL (ref 30.0–36.0)
MCV: 89.9 fl (ref 78.0–100.0)
Monocytes Absolute: 0.5 10*3/uL (ref 0.1–1.0)
Monocytes Relative: 6.7 % (ref 3.0–12.0)
Neutro Abs: 4.3 10*3/uL (ref 1.4–7.7)
Neutrophils Relative %: 60.8 % (ref 43.0–77.0)
Platelets: 276 10*3/uL (ref 150.0–400.0)
RBC: 4.85 Mil/uL (ref 4.22–5.81)
RDW: 14 % (ref 11.5–14.6)
WBC: 7.1 10*3/uL (ref 4.5–10.5)

## 2011-09-17 LAB — TSH: TSH: 2.53 u[IU]/mL (ref 0.35–5.50)

## 2011-09-17 MED ORDER — CYCLOBENZAPRINE HCL 10 MG PO TABS: 10.0000 mg | ORAL_TABLET | Freq: Three times a day (TID) | ORAL | Status: DC | PRN

## 2011-09-17 MED ORDER — CLONAZEPAM 0.5 MG PO TABS: 0.5000 mg | ORAL_TABLET | Freq: Every evening | ORAL | Status: DC | PRN

## 2011-09-17 NOTE — Progress Notes (Signed)
  Subjective:    Patient ID: John Frye, male    DOB: 22-Jun-1956, 55 y.o.   MRN: [id number]  HPI  Medical followup and also here for recent acute issue of abdominal bloating left lower quadrant past one month off-and-on. He relates decreased frequency of stools. At baseline has sometimes 2-3 stools per day but less frequent over the past couple months. No bloody stools. Intermittent cramping left lower abdomen. No history of diarrhea. He lost some weight this year due to his efforts. No appetite change. He lived with foster family so family history unknown. Patient had remote colonoscopy around 2001. We do not have those records. He recalls some type of benign polyp was found then. He was never scheduled for followup.  Hypothyroidism treated with levothyroxine 112 mcg daily.  Compliant with therapy. Needs repeat TSH  History of chronic intermittent insomnia. Takes low-dose Klonopin 0.5 mg rarely and requesting refill. Also intermittent low back pain and uses cyclobenzaprine on an as-needed basis. No current back problems.  Past Medical History  Diagnosis Date  . Dermatophytosis of nail 05/08/2008  . [place] DISEASE 05/08/2008  . HYPOTHYROIDISM 10/10/2009  . OBSTRUCTIVE SLEEP APNEA 05/08/2008  . ALLERGIC RHINITIS 05/12/2008  . SHOULDER PAIN, BILATERAL 10/10/2009  . BACK PAIN 05/08/2008  . PLANTAR FASCIITIS 05/08/2008  . INSOMNIA 06/17/2010  . PREDIABETES 09/18/2010   Past Surgical History  Procedure Date  . Tonsillectomy     reports that he has never smoked. He does not have any smokeless tobacco history on file. His alcohol and drug histories not on file. family history includes Stroke (age of onset:60) in his father. Not on File    Review of Systems  Constitutional: Negative for fever, appetite change and unexpected weight change.  Respiratory: Negative for shortness of breath.   Cardiovascular: Negative for chest pain.  Gastrointestinal: Positive for abdominal pain. Negative for nausea,  vomiting, diarrhea, constipation, blood in stool and abdominal distention.  Genitourinary: Negative for dysuria.  Musculoskeletal: Negative for back pain.  Neurological: Negative for syncope and weakness.       Objective:   Physical Exam  Constitutional: He is oriented to person, place, and time. He appears well-developed and well-nourished. No distress.  Neck: Neck supple.  Cardiovascular: Normal rate, regular rhythm and normal heart sounds.   Pulmonary/Chest: Effort normal and breath sounds normal. No respiratory distress. He has no wheezes. He has no rales.  Abdominal: Soft. Bowel sounds are normal. He exhibits no distension and no mass. There is no tenderness. There is no rebound and no guarding.  Genitourinary: Rectum normal. Guaiac negative stool.  Musculoskeletal: He exhibits no edema.  Lymphadenopathy:    He has no cervical adenopathy.  Neurological: He is alert and oriented to person, place, and time.          Assessment & Plan:  #1 left lower quadrant abdominal bloating intermittently with change in stool frequency. Given his age of 55 and in no colonoscopy 11 years refer for repeat colonoscopy. Check CBC. Hemoccult negative today. Clinically, this does not sound like diverticulitis #2 hypothyroidism. Recheck TSH #3 chronic intermittent insomnia. Sleep hygiene discussed. Refill Klonopin for rare as needed use #4 intermittent low back pain. Refilled cyclobenzaprine but avoid concomitant use with clonazepam

## 2011-09-17 NOTE — Patient Instructions (Signed)
Continue to work on weight loss We will call you regarding colonoscopy referral.

## 2011-09-18 NOTE — Progress Notes (Signed)
Quick Note:  Wife informed on home phone ______

## 2011-10-07 ENCOUNTER — Other Ambulatory Visit: Payer: Self-pay | Admitting: Family Medicine

## 2011-10-07 MED ORDER — CLONAZEPAM 0.5 MG PO TABS: 0.5000 mg | ORAL_TABLET | Freq: Every evening | ORAL | Status: DC | PRN

## 2011-10-07 MED ORDER — CYCLOBENZAPRINE HCL 10 MG PO TABS: 10.0000 mg | ORAL_TABLET | Freq: Three times a day (TID) | ORAL | Status: DC | PRN

## 2011-10-07 MED ORDER — LEVOTHYROXINE SODIUM 112 MCG PO TABS: 112.0000 ug | ORAL_TABLET | Freq: Every day | ORAL | Status: DC

## 2011-10-07 NOTE — Telephone Encounter (Signed)
May refill for 90 days with additional refill

## 2011-10-07 NOTE — Telephone Encounter (Signed)
Pr requesting 90 day refills on cyclobenzaprine (FLEXERIL) 10 MG tablet, clonazePAM (KLONOPIN) 0.5 MG tablet and levothyroxine (SYNTHROID, LEVOTHROID) 112 MCG tablet  please send to [facility], [place]

## 2011-10-07 NOTE — Telephone Encounter (Signed)
Clonazepam last filled 09-17-11 at [facility] (printed Rx), #30 with 2 refills

## 2011-10-07 NOTE — Telephone Encounter (Signed)
meds sent and clonazepam faxed, confirmation received

## 2012-01-20 ENCOUNTER — Telehealth: Payer: Self-pay | Admitting: Family Medicine

## 2012-01-20 MED ORDER — LEVOTHYROXINE SODIUM 112 MCG PO TABS: 112.0000 ug | ORAL_TABLET | Freq: Every day | ORAL | Status: DC

## 2012-01-20 NOTE — Telephone Encounter (Signed)
Pt is needing a new script for 90 day supply levothyroxine (SYNTHROID, LEVOTHROID) 112 MCG tablet to [facility] mail order. Pt only has 5 pills remaining.

## 2012-09-27 ENCOUNTER — Other Ambulatory Visit: Payer: Self-pay | Admitting: Family Medicine

## 2013-01-04 ENCOUNTER — Ambulatory Visit (INDEPENDENT_AMBULATORY_CARE_PROVIDER_SITE_OTHER): Payer: 59 | Admitting: Family Medicine

## 2013-01-04 ENCOUNTER — Encounter: Payer: Self-pay | Admitting: Family Medicine

## 2013-01-04 VITALS — BP 102/64 | Temp 98.9°F | Wt 281.0 lb

## 2013-01-04 DIAGNOSIS — D239 Other benign neoplasm of skin, unspecified: Secondary | ICD-10-CM

## 2013-01-04 DIAGNOSIS — E039 Hypothyroidism, unspecified: Secondary | ICD-10-CM

## 2013-01-04 DIAGNOSIS — D229 Melanocytic nevi, unspecified: Secondary | ICD-10-CM

## 2013-01-04 DIAGNOSIS — B351 Tinea unguium: Secondary | ICD-10-CM

## 2013-01-04 DIAGNOSIS — R7309 Other abnormal glucose: Secondary | ICD-10-CM

## 2013-01-04 MED ORDER — LEVOTHYROXINE SODIUM 112 MCG PO TABS: 112.0000 ug | ORAL_TABLET | Freq: Every day | ORAL | Status: DC

## 2013-01-04 MED ORDER — TERBINAFINE HCL 250 MG PO TABS: 250.0000 mg | ORAL_TABLET | Freq: Every day | ORAL | Status: DC

## 2013-01-04 NOTE — Progress Notes (Signed)
  Subjective:    Patient ID: John Frye, male    DOB: 1955-12-19, 57 y.o.   MRN: [id number]  HPI Patient here for the following issues  Hypothyroidism treated with levothyroxine. Needs refills. No labs in over one year. Compliant with therapy. Patient has had some weight gain over the past couple years which he attributes to lack of exercise and poor compliance with diet  Patient has onychomycosis changes right great toenail. Brittle thickened changes. He is interested in possible treatment. No history of liver problems.  History of multiple nevi. No history of skin cancer. He is adopted so biological parent history is unknown  Past Medical History  Diagnosis Date  . Dermatophytosis of nail 05/08/2008  . [place] DISEASE 05/08/2008  . HYPOTHYROIDISM 10/10/2009  . OBSTRUCTIVE SLEEP APNEA 05/08/2008  . ALLERGIC RHINITIS 05/12/2008  . SHOULDER PAIN, BILATERAL 10/10/2009  . BACK PAIN 05/08/2008  . PLANTAR FASCIITIS 05/08/2008  . INSOMNIA 06/17/2010  . PREDIABETES 09/18/2010   Past Surgical History  Procedure Laterality Date  . Tonsillectomy      reports that he has never smoked. He does not have any smokeless tobacco history on file. His alcohol and drug histories are not on file. family history includes Stroke (age of onset: 86) in his father. No Known Allergies    Review of Systems  Constitutional: Negative for fever and appetite change.  Respiratory: Negative for cough and shortness of breath.   Cardiovascular: Negative for chest pain, palpitations and leg swelling.  Gastrointestinal: Negative for abdominal pain.       Objective:   Physical Exam  Constitutional: He appears well-developed and well-nourished. No distress.  Neck: Neck supple. No thyromegaly present.  Cardiovascular: Normal rate and regular rhythm.   Pulmonary/Chest: Effort normal and breath sounds normal. No respiratory distress. He has no wheezes. He has no rales.  Abdominal: Soft. Bowel sounds are normal. He exhibits  no distension and no mass. There is no tenderness. There is no rebound and no guarding.  Musculoskeletal: He exhibits no edema.  Skin:  Right great toe nail reveals brittle thickened changes. Patient's several scattered nevi trunk. No significant atypical changes.          Assessment & Plan:  #1 hypothyroidism. Recheck TSH #2 onychomycosis right great toenail. Discussed options. Check hepatic panel. If normal start Lamisil 250 mg once daily for 3 months. We explained topical treatments do not work well for this #3 benign nevi trunk. Reassurance #4 health maintenance. Consider complete physical at some point later this year

## 2013-01-04 NOTE — Patient Instructions (Addendum)
Ringworm, Nail A fungal infection of the nail (tinea unguium/onychomycosis) is common. It is common as the visible part of the nail is composed of dead cells which have no blood supply to help prevent infection. It occurs because fungi are everywhere and will pick any opportunity to grow on any dead material. Because nails are very slow growing they require up to 2 years of treatment with anti-fungal medications. The entire nail back to the base is infected. This includes approximately  of the nail which you cannot see. If your caregiver has prescribed a medication by mouth, take it every day and as directed. No progress will be seen for at least 6 to 9 months. Do not be disappointed! Because fungi live on dead cells with little or no exposure to blood supply, medication delivery to the infection is slow; thus the cure is slow. It is also why you can observe no progress in the first 6 months. The nail becoming cured is the base of the nail, as it has the blood supply. Topical medication such as creams and ointments are usually not effective. Important in successful treatment of nail fungus is closely following the medication regimen that your doctor prescribes. Sometimes you and your caregiver may elect to speed up this process by surgical removal of all the nails. Even this may still require 6 to 9 months of additional oral medications. See your caregiver as directed. Remember there will be no visible improvement for at least 6 months. See your caregiver sooner if other signs of infection (redness and swelling) develop. Document Released: 09/18/2000 Document Revised: 12/14/2011 Document Reviewed: 11/27/2008 [facility] Patient Information 2013 [place].  Consider complete physical at some point later this year.

## 2013-01-05 ENCOUNTER — Other Ambulatory Visit: Payer: Self-pay | Admitting: *Deleted

## 2013-01-05 LAB — HEPATIC FUNCTION PANEL
ALT: 26 U/L (ref 0–53)
AST: 15 U/L (ref 0–37)
Albumin: 4.2 g/dL (ref 3.5–5.2)
Alkaline Phosphatase: 72 U/L (ref 39–117)
Bilirubin, Direct: 0.1 mg/dL (ref 0.0–0.3)
Total Bilirubin: 0.6 mg/dL (ref 0.3–1.2)
Total Protein: 7 g/dL (ref 6.0–8.3)

## 2013-01-05 LAB — TSH: TSH: 3.13 u[IU]/mL (ref 0.35–5.50)

## 2013-01-05 MED ORDER — TERBINAFINE HCL 250 MG PO TABS: 250.0000 mg | ORAL_TABLET | Freq: Every day | ORAL | Status: DC

## 2013-01-05 NOTE — Progress Notes (Signed)
Quick Note:  Pt wife informed, she requested the Lamisil be sent emectronically to [facility] ______

## 2013-01-12 ENCOUNTER — Telehealth: Payer: Self-pay | Admitting: Family Medicine

## 2013-01-12 NOTE — Telephone Encounter (Signed)
Caller: Alex/Patient; Phone: [phone]; Reason for Call: Patient calling about lab results before taking Lamisil prescription could be started.  Per epic, Dr.  Elease Hashimoto wrote 01/05/13 "Notes Recorded by Dow Adolph, LPN on 10/13/9410 at 4: 25 PM Pt wife informed, she requested the Lamisil be sent emectronically to [facility] Notes Recorded by Eulas Post, MD on 01/05/2013 at 1: 01 PM Labs all OK.  OK to start Lamisil.  " Patient advised; no further questions or concerns.  Patient would like to schedule well visit; caller transferred to office to schedule appt.  Krs/can

## 2013-02-16 ENCOUNTER — Encounter: Payer: Self-pay | Admitting: Family Medicine

## 2013-02-16 ENCOUNTER — Ambulatory Visit (INDEPENDENT_AMBULATORY_CARE_PROVIDER_SITE_OTHER): Payer: 59 | Admitting: Family Medicine

## 2013-02-16 VITALS — BP 120/82 | HR 72 | Temp 98.4°F | Resp 12 | Ht 74.0 in | Wt 277.0 lb

## 2013-02-16 DIAGNOSIS — Z0000 Encounter for general adult medical examination without abnormal findings: Secondary | ICD-10-CM

## 2013-02-16 LAB — BASIC METABOLIC PANEL
BUN: 10 mg/dL (ref 6–23)
CO2: 30 mEq/L (ref 19–32)
Calcium: 9.1 mg/dL (ref 8.4–10.5)
Chloride: 101 mEq/L (ref 96–112)
Creatinine, Ser: 0.8 mg/dL (ref 0.4–1.5)
GFR: 106.71 mL/min (ref >60.00)
Glucose, Bld: 184 mg/dL — ABNORMAL HIGH (ref 70–99)
Potassium: 4.2 mEq/L (ref 3.5–5.1)
Sodium: 136 mEq/L (ref 135–145)

## 2013-02-16 LAB — LIPID PANEL
Cholesterol: 182 mg/dL (ref 0–200)
HDL: 37.1 mg/dL — ABNORMAL LOW (ref >39.00)
LDL Cholesterol: 121 mg/dL — ABNORMAL HIGH (ref 0–99)
Total CHOL/HDL Ratio: 5
Triglycerides: 118 mg/dL (ref 0.0–149.0)
VLDL: 23.6 mg/dL (ref 0.0–40.0)

## 2013-02-16 LAB — CBC WITH DIFFERENTIAL/PLATELET
Basophils Absolute: 0.1 10*3/uL (ref 0.0–0.1)
Basophils Relative: 0.7 % (ref 0.0–3.0)
Eosinophils Absolute: 0.2 10*3/uL (ref 0.0–0.7)
Eosinophils Relative: 2.3 % (ref 0.0–5.0)
HCT: 44 % (ref 39.0–52.0)
Hemoglobin: 15.1 g/dL (ref 13.0–17.0)
Lymphocytes Relative: 32.1 % (ref 12.0–46.0)
Lymphs Abs: 2.8 10*3/uL (ref 0.7–4.0)
MCHC: 34.3 g/dL (ref 30.0–36.0)
MCV: 87 fl (ref 78.0–100.0)
Monocytes Absolute: 0.8 10*3/uL (ref 0.1–1.0)
Monocytes Relative: 9.3 % (ref 3.0–12.0)
Neutro Abs: 4.9 10*3/uL (ref 1.4–7.7)
Neutrophils Relative %: 55.6 % (ref 43.0–77.0)
Platelets: 275 10*3/uL (ref 150.0–400.0)
RBC: 5.05 Mil/uL (ref 4.22–5.81)
RDW: 13.5 % (ref 11.5–14.6)
WBC: 8.8 10*3/uL (ref 4.5–10.5)

## 2013-02-16 LAB — PSA: PSA: 0.34 ng/mL (ref 0.10–4.00)

## 2013-02-16 LAB — HEPATIC FUNCTION PANEL
ALT: 24 U/L (ref 0–53)
AST: 16 U/L (ref 0–37)
Albumin: 4.2 g/dL (ref 3.5–5.2)
Alkaline Phosphatase: 74 U/L (ref 39–117)
Bilirubin, Direct: 0.1 mg/dL (ref 0.0–0.3)
Total Bilirubin: 0.9 mg/dL (ref 0.3–1.2)
Total Protein: 6.7 g/dL (ref 6.0–8.3)

## 2013-02-16 MED ORDER — HYDROCODONE-HOMATROPINE 5-1.5 MG/5ML PO SYRP: 5.0000 mL | ORAL_SOLUTION | Freq: Four times a day (QID) | ORAL | Status: AC | PRN

## 2013-02-16 NOTE — Progress Notes (Signed)
  Subjective:    Patient ID: John Frye, male    DOB: [birth year], 57 y.o.   MRN: [id number]  HPI Complete physical. Patient has remote history of Graves' disease. Now takes thyroid replacement. History of obstructive sleep apnea. Also history prediabetes. No symptoms of hyperglycemia. Recently we initiated treatment for onychomycosis and is taking Lamisil without side effects. Tetanus is up-to-date. Colonoscopy up to date. Currently no consistent exercise. Patient is nonsmoker  [facility]. Treated with Tamiflu. Overall improved but has lingering cough especially bothersome at night. No relief with Tessalon Perles. No wheezing.  Past Medical History  Diagnosis Date  . Dermatophytosis of nail 05/08/2008  . [place] DISEASE 05/08/2008  . HYPOTHYROIDISM 10/10/2009  . OBSTRUCTIVE SLEEP APNEA 05/08/2008  . ALLERGIC RHINITIS 05/12/2008  . SHOULDER PAIN, BILATERAL 10/10/2009  . BACK PAIN 05/08/2008  . PLANTAR FASCIITIS 05/08/2008  . INSOMNIA 06/17/2010  . PREDIABETES 09/18/2010   Past Surgical History  Procedure Laterality Date  . Tonsillectomy      reports that he has never smoked. He does not have any smokeless tobacco history on file. His alcohol and drug histories are not on file. family history includes Stroke (age of onset: 58) in his father. No Known Allergies    Review of Systems  Constitutional: Negative for fever, activity change, appetite change and fatigue.  HENT: Negative for ear pain, congestion and trouble swallowing.   Eyes: Negative for pain and visual disturbance.  Respiratory: Positive for cough. Negative for shortness of breath and wheezing.   Cardiovascular: Negative for chest pain and palpitations.  Gastrointestinal: Negative for nausea, vomiting, abdominal pain, diarrhea, constipation, blood in stool, abdominal distention and rectal pain.  Genitourinary: Negative for dysuria, hematuria and testicular pain.   Musculoskeletal: Negative for joint swelling and arthralgias.  Skin: Negative for rash.  Neurological: Negative for dizziness, syncope and headaches.  Hematological: Negative for adenopathy.  Psychiatric/Behavioral: Negative for confusion and dysphoric mood.       Objective:   Physical Exam  Constitutional: He is oriented to person, place, and time. He appears well-developed and well-nourished. No distress.  HENT:  Head: Normocephalic and atraumatic.  Right Ear: External ear normal.  Left Ear: External ear normal.  Mouth/Throat: Oropharynx is clear and moist.  Eyes: Conjunctivae and EOM are normal. Pupils are equal, round, and reactive to light.  Neck: Normal range of motion. Neck supple. No thyromegaly present.  Cardiovascular: Normal rate, regular rhythm and normal heart sounds.   No murmur heard. Pulmonary/Chest: No respiratory distress. He has no wheezes. He has no rales.  Abdominal: Soft. Bowel sounds are normal. He exhibits no distension and no mass. There is no tenderness. There is no rebound and no guarding.  Musculoskeletal: He exhibits no edema.  Lymphadenopathy:    He has no cervical adenopathy.  Neurological: He is alert and oriented to person, place, and time. He displays normal reflexes. No cranial nerve deficit.  Skin: No rash noted.  Psychiatric: He has a normal mood and affect.          Assessment & Plan:  Complete physical. Obtain screening lab work. Did not check TSH since this was checked last month. We will check on coverage for shingles vaccine  Cough following recent viral syndrome. Hycodan cough syrup for nighttime use as needed

## 2013-02-16 NOTE — Patient Instructions (Addendum)
Check on insurance coverage for shingles vaccine.

## 2013-02-17 ENCOUNTER — Other Ambulatory Visit: Payer: Self-pay | Admitting: *Deleted

## 2013-02-17 DIAGNOSIS — R7309 Other abnormal glucose: Secondary | ICD-10-CM

## 2013-02-17 NOTE — Progress Notes (Signed)
Quick Note:  Pt informed, he will call back for fasting CBG and A1C ______

## 2013-02-20 ENCOUNTER — Other Ambulatory Visit (INDEPENDENT_AMBULATORY_CARE_PROVIDER_SITE_OTHER): Payer: 59

## 2013-02-20 DIAGNOSIS — R7309 Other abnormal glucose: Secondary | ICD-10-CM

## 2013-02-20 LAB — HEMOGLOBIN A1C: Hgb A1c MFr Bld: 9.3 % — ABNORMAL HIGH (ref 4.6–6.5)

## 2013-02-22 ENCOUNTER — Telehealth: Payer: Self-pay | Admitting: Family Medicine

## 2013-02-22 NOTE — Telephone Encounter (Signed)
PT calling to inquire about his lab results from 5/19. Please assist.

## 2013-02-23 ENCOUNTER — Telehealth: Payer: Self-pay | Admitting: Family Medicine

## 2013-02-23 MED ORDER — METFORMIN HCL 500 MG PO TABS: 500.0000 mg | ORAL_TABLET | Freq: Two times a day (BID) | ORAL | Status: DC

## 2013-02-23 NOTE — Telephone Encounter (Signed)
A1C lab information reviewed with pt, Rx called in and pt will schedule 3 month F/U

## 2013-02-23 NOTE — Telephone Encounter (Signed)
I left a message for Ms Kintz informing her the Metformin will be [facility] and that she may pick up packet of diet tips at window #5

## 2013-02-23 NOTE — Telephone Encounter (Signed)
PT wife called and stated that the RX of metFORMIN (GLUCOPHAGE) 500 MG tablet that was called into [facility], needs to go to [facility]. She also stated that the patient needed further instructions on his diet changes, he is under the impression that he can have no fruit. Please assist.

## 2013-02-24 ENCOUNTER — Telehealth: Payer: Self-pay | Admitting: Family Medicine

## 2013-02-24 NOTE — Telephone Encounter (Signed)
Patients wife stopped by today and stated that she would like to hear from the doctor regarding her husbands diagnosis. She stated that they would like to learn more about managing his sugar and the details about diabetes. Please assist.

## 2013-02-28 NOTE — Telephone Encounter (Signed)
I called and left a message asking pt wife and her husband schedule OV so they can be together with Dr Elease Hashimoto to answer their questions.  I did put together a packet of information for his wife earlier last week and I'm going to assume she picked it up when she left this message for [place] at window #5.  FYI

## 2013-03-03 ENCOUNTER — Encounter: Payer: Self-pay | Admitting: Family Medicine

## 2013-03-03 ENCOUNTER — Ambulatory Visit (INDEPENDENT_AMBULATORY_CARE_PROVIDER_SITE_OTHER): Payer: 59 | Admitting: Family Medicine

## 2013-03-03 VITALS — BP 100/70 | Temp 98.5°F | Wt 274.0 lb

## 2013-03-03 DIAGNOSIS — R05 Cough: Secondary | ICD-10-CM

## 2013-03-03 DIAGNOSIS — R059 Cough, unspecified: Secondary | ICD-10-CM

## 2013-03-03 DIAGNOSIS — IMO0001 Reserved for inherently not codable concepts without codable children: Secondary | ICD-10-CM

## 2013-03-03 DIAGNOSIS — IMO0002 Reserved for concepts with insufficient information to code with codable children: Secondary | ICD-10-CM

## 2013-03-03 DIAGNOSIS — E1165 Type 2 diabetes mellitus with hyperglycemia: Secondary | ICD-10-CM

## 2013-03-03 DIAGNOSIS — E119 Type 2 diabetes mellitus without complications: Secondary | ICD-10-CM | POA: Insufficient documentation

## 2013-03-03 MED ORDER — BENZONATATE 200 MG PO CAPS: 200.0000 mg | ORAL_CAPSULE | Freq: Three times a day (TID) | ORAL | Status: DC | PRN

## 2013-03-03 NOTE — Progress Notes (Signed)
  Subjective:    Patient ID: John Frye, male    DOB: 1956-04-23, 57 y.o.   MRN: [id number]  HPI Followup type 2 diabetes. Recently diagnosed on complete physical. A1c 9.3%. No significant symptoms of hyperglycemia.  He has already made substantial changes with exercising and dietary changes. Has lost 3 pounds. Does not have home glucose monitor. We started metformin 500 mg twice daily. Tolerating without side effects.  Still some dry cough. No fever. No dyspnea. No wheezing. Hydrocodone cough syrup helped but he had some vivid dreams.  Past Medical History  Diagnosis Date  . Dermatophytosis of nail 05/08/2008  . [place] DISEASE 05/08/2008  . HYPOTHYROIDISM 10/10/2009  . OBSTRUCTIVE SLEEP APNEA 05/08/2008  . ALLERGIC RHINITIS 05/12/2008  . SHOULDER PAIN, BILATERAL 10/10/2009  . BACK PAIN 05/08/2008  . PLANTAR FASCIITIS 05/08/2008  . INSOMNIA 06/17/2010  . PREDIABETES 09/18/2010   Past Surgical History  Procedure Laterality Date  . Tonsillectomy      reports that he has never smoked. He does not have any smokeless tobacco history on file. His alcohol and drug histories are not on file. family history includes Stroke (age of onset: 43) in his father. No Known Allergies    Review of Systems  Constitutional: Negative for fever, appetite change and unexpected weight change.  HENT: Negative for congestion and postnasal drip.   Respiratory: Positive for cough. Negative for shortness of breath and wheezing.   Endocrine: Negative for polydipsia and polyuria.  Genitourinary: Negative for dysuria.  Neurological: Negative for headaches.  Hematological: Negative for adenopathy.       Objective:   Physical Exam  Constitutional: He appears well-developed and well-nourished.  HENT:  Right Ear: External ear normal.  Left Ear: External ear normal.  Mouth/Throat: Oropharynx is clear and moist.  Neck: Neck supple. No thyromegaly present.  Cardiovascular: Normal rate and regular rhythm.    Pulmonary/Chest: Effort normal and breath sounds normal. No respiratory distress. He has no wheezes. He has no rales.  Musculoskeletal: He exhibits no edema.          Assessment & Plan:  Type 2 diabetes. Recent diagnosis. Obtain home glucose monitor. Continue lifestyle modification. Reassess 2 months and repeat A1c then  Dry cough. Suspect recent viral bronchitis. Tessalon Perles 200 mg every 8 hours as needed.

## 2013-03-03 NOTE — Patient Instructions (Addendum)
Type 2 Diabetes Mellitus, Adult Type 2 diabetes mellitus, often simply referred to as type 2 diabetes, is a long-lasting (chronic) disease. In type 2 diabetes, the pancreas does not make enough insulin (a hormone), the cells are less responsive to the insulin that is made (insulin resistance), or both. Normally, insulin moves sugars from food into the tissue cells. The tissue cells use the sugars for energy. The lack of insulin or the lack of normal response to insulin causes excess sugars to build up in the blood instead of going into the tissue cells. As a result, high blood sugar (hyperglycemia) develops. The effect of high sugar (glucose) levels can cause many complications. Type 2 diabetes was also previously called adult-onset diabetes but it can occur at any age.  RISK FACTORS  A person is predisposed to developing type 2 diabetes if someone in the family has the disease and also has one or more of the following primary risk factors:  Overweight.  An inactive lifestyle.  A history of consistently eating high-calorie foods. Maintaining a normal weight and regular physical activity can reduce the chance of developing type 2 diabetes. SYMPTOMS  A person with type 2 diabetes may not show symptoms initially. The symptoms of type 2 diabetes appear slowly. The symptoms include:  Increased thirst (polydipsia).  Increased urination (polyuria).  Increased urination during the night (nocturia).  Weight loss. This weight loss may be rapid.  Frequent, recurring infections.  Tiredness (fatigue).  Weakness.  Vision changes, such as blurred vision.  Fruity smell to your breath.  Abdominal pain.  Nausea or vomiting.  Cuts or bruises which are slow to heal.  Tingling or numbness in the hands or feet. DIAGNOSIS Type 2 diabetes is frequently not diagnosed until complications of diabetes are present. Type 2 diabetes is diagnosed when symptoms or complications are present and when blood  glucose levels are increased. Your blood glucose level may be checked by one or more of the following blood tests:  A fasting blood glucose test. You will not be allowed to eat for at least 8 hours before a blood sample is taken.  A random blood glucose test. Your blood glucose is checked at any time of the day regardless of when you ate.  A hemoglobin A1c blood glucose test. A hemoglobin A1c test provides information about blood glucose control over the previous 3 months.  An oral glucose tolerance test (OGTT). Your blood glucose is measured after you have not eaten (fasted) for 2 hours and then after you drink a glucose-containing beverage. TREATMENT   You may need to take insulin or diabetes medicine daily to keep blood glucose levels in the desired range.  You will need to match insulin dosing with exercise and healthy food choices. The treatment goal is to maintain the before meal blood sugar (preprandial glucose) level at 70 130 mg/dL. HOME CARE INSTRUCTIONS   Have your hemoglobin A1c level checked twice a year.  Perform daily blood glucose monitoring as directed by your caregiver.  Monitor urine ketones when you are ill and as directed by your caregiver.  Take your diabetes medicine or insulin as directed by your caregiver to maintain your blood glucose levels in the desired range.  Never run out of diabetes medicine or insulin. It is needed every day.  Adjust insulin based on your intake of carbohydrates. Carbohydrates can raise blood glucose levels but need to be included in your diet. Carbohydrates provide vitamins, minerals, and fiber which are an essential part of  a healthy diet. Carbohydrates are found in fruits, vegetables, whole grains, dairy products, legumes, and foods containing added sugars.    Eat healthy foods. Alternate 3 meals with 3 snacks.  Lose weight if overweight.  Carry a medical alert card or wear your medical alert jewelry.  Carry a 15 gram  carbohydrate snack with you at all times to treat low blood glucose (hypoglycemia). Some examples of 15 gram carbohydrate snacks include:  Glucose tablets, 3 or 4   Glucose gel, 15 gram tube  Raisins, 2 tablespoons (24 grams)  Jelly beans, 6  Animal crackers, 8  Regular pop, 4 ounces (120 mL)  Gummy treats, 9  Recognize hypoglycemia. Hypoglycemia occurs with blood glucose levels of 70 mg/dL and below. The risk for hypoglycemia increases when fasting or skipping meals, during or after intense exercise, and during sleep. Hypoglycemia symptoms can include:  Tremors or shakes.  Decreased ability to concentrate.  Sweating.  Increased heart rate.  Headache.  Dry mouth.  Hunger.  Irritability.  Anxiety.  Restless sleep.  Altered speech or coordination.  Confusion.  Treat hypoglycemia promptly. If you are alert and able to safely swallow, follow the 15:15 rule:  Take 15 20 grams of rapid-acting glucose or carbohydrate. Rapid-acting options include glucose gel, glucose tablets, or 4 ounces (120 mL) of fruit juice, regular soda, or low fat milk.  Check your blood glucose level 15 minutes after taking the glucose.  Take 15 20 grams more of glucose if the repeat blood glucose level is still 70 mg/dL or below.  Eat a meal or snack within 1 hour once blood glucose levels return to normal.    Be alert to polyuria and polydipsia which are early signs of hyperglycemia. An early awareness of hyperglycemia allows for prompt treatment. Treat hyperglycemia as directed by your caregiver.  Engage in at least 150 minutes of moderate-intensity physical activity a week, spread over at least 3 days of the week or as directed by your caregiver. In addition, you should engage in resistance exercise at least 2 times a week or as directed by your caregiver.  Adjust your medicine and food intake as needed if you start a new exercise or sport.  Follow your sick day plan at any time you  are unable to eat or drink as usual.  Avoid tobacco use.  Limit alcohol intake to no more than 1 drink per day for nonpregnant women and 2 drinks per day for men. You should drink alcohol only when you are also eating food. Talk with your caregiver whether alcohol is safe for you. Tell your caregiver if you drink alcohol several times a week.  Follow up with your caregiver regularly.  Schedule an eye exam soon after the diagnosis of type 2 diabetes and then annually.  Perform daily skin and foot care. Examine your skin and feet daily for cuts, bruises, redness, nail problems, bleeding, blisters, or sores. A foot exam by a caregiver should be done annually.  Brush your teeth and gums at least twice a day and floss at least once a day. Follow up with your dentist regularly.  Share your diabetes management plan with your workplace or school.  Stay up-to-date with immunizations.  Learn to manage stress.  Obtain ongoing diabetes education and support as needed.  Participate in, or seek rehabilitation as needed to maintain or improve independence and quality of life. Request a physical or occupational therapy referral if you are having foot or hand numbness or difficulties with grooming,  dressing, eating, or physical activity. SEEK MEDICAL CARE IF:   You are unable to eat food or drink fluids for more than 6 hours.  You have nausea and vomiting for more than 6 hours.  Your blood glucose level is over 240 mg/dL.  There is a change in mental status.  You develop an additional serious illness.  You have diarrhea for more than 6 hours.  You have been sick or have had a fever for a couple of days and are not getting better.  You have pain during any physical activity.  SEEK IMMEDIATE MEDICAL CARE IF:  You have difficulty breathing.  You have moderate to large ketone levels. MAKE SURE YOU:  Understand these instructions.  Will watch your condition.  Will get help right away if  you are not doing well or get worse. Document Released: 09/21/2005 Document Revised: 06/15/2012 Document Reviewed: 04/19/2012 [facility] [employer] Patient Information 2014 [place].  Monitor home blood sugars periodically and record Consider alternating fasting blood sugars with 2 hour postprandial

## 2013-03-06 ENCOUNTER — Telehealth: Payer: Self-pay | Admitting: Family Medicine

## 2013-03-06 MED ORDER — GLUCOSE BLOOD VI STRP: 1.0000 | ORAL_STRIP | Freq: Two times a day (BID) | Status: DC | PRN

## 2013-03-06 NOTE — Telephone Encounter (Signed)
Spoke to pt told him Rx for Test strips was sent to pharmacy and that he opened the monitor then he can not return it to [place]. Pt verbalized understanding.

## 2013-03-06 NOTE — Telephone Encounter (Signed)
Pt spoke with insurance company and they are requesting that he get a ultra blue test monitor. He'll be testing twice a day, so he would also like an Rx sent to [facility], for a 300 count (90 day supply) of the Ultra blue test strips. Please assist. (PT also stated that he picked up a monitor from our office last week and would be happy to return it if we could use it.)

## 2013-03-10 ENCOUNTER — Telehealth: Payer: Self-pay | Admitting: Family Medicine

## 2013-03-10 ENCOUNTER — Telehealth: Payer: Self-pay | Admitting: *Deleted

## 2013-03-10 MED ORDER — METFORMIN HCL 500 MG PO TABS: 500.0000 mg | ORAL_TABLET | Freq: Two times a day (BID) | ORAL | Status: DC

## 2013-03-10 MED ORDER — ONETOUCH DELICA LANCETS FINE MISC: 1.0000 | Freq: Every day | Status: DC | PRN

## 2013-03-10 NOTE — Telephone Encounter (Signed)
Rxs sent

## 2013-03-10 NOTE — Telephone Encounter (Signed)
Pt needs  lancets for [place] Ultra Blue Onetouch  AND metFORMIN (GLUCOPHAGE) 500 MG tablet (90 day supply) Pharm/  [facility]

## 2013-03-10 NOTE — Telephone Encounter (Signed)
error 

## 2013-04-13 ENCOUNTER — Telehealth: Payer: Self-pay | Admitting: Family Medicine

## 2013-04-13 NOTE — Telephone Encounter (Signed)
Last Refill: 01/05/2013  #90

## 2013-04-13 NOTE — Telephone Encounter (Signed)
Pt needs refill of terbinafine (LAMISIL) 250 MG tablet Pt has new pharm: [facility]  90 refill  1/day

## 2013-04-13 NOTE — Telephone Encounter (Signed)
We can refill once but generally this is given for 90 day treatment course only.

## 2013-04-14 MED ORDER — TERBINAFINE HCL 250 MG PO TABS: 250.0000 mg | ORAL_TABLET | Freq: Every day | ORAL | Status: DC

## 2013-04-14 NOTE — Telephone Encounter (Signed)
RX sent to mail order

## 2013-05-15 ENCOUNTER — Telehealth: Payer: Self-pay | Admitting: Family Medicine

## 2013-05-15 DIAGNOSIS — E1165 Type 2 diabetes mellitus with hyperglycemia: Secondary | ICD-10-CM

## 2013-05-15 DIAGNOSIS — IMO0002 Reserved for concepts with insufficient information to code with codable children: Secondary | ICD-10-CM

## 2013-05-15 NOTE — Telephone Encounter (Signed)
PT is requesting to come in for his A1C prior to his fu. Please assist.

## 2013-05-15 NOTE — Telephone Encounter (Signed)
I have ordered A1C for him to get prior to appt.

## 2013-05-16 ENCOUNTER — Other Ambulatory Visit (INDEPENDENT_AMBULATORY_CARE_PROVIDER_SITE_OTHER): Payer: 59

## 2013-05-16 DIAGNOSIS — E1165 Type 2 diabetes mellitus with hyperglycemia: Secondary | ICD-10-CM

## 2013-05-16 DIAGNOSIS — IMO0002 Reserved for concepts with insufficient information to code with codable children: Secondary | ICD-10-CM

## 2013-05-16 DIAGNOSIS — IMO0001 Reserved for inherently not codable concepts without codable children: Secondary | ICD-10-CM

## 2013-05-16 LAB — HEMOGLOBIN A1C: Hgb A1c MFr Bld: 7.3 % — ABNORMAL HIGH (ref 4.6–6.5)

## 2013-05-16 NOTE — Telephone Encounter (Signed)
Can you please set patient appointment up.... Thank you

## 2013-05-22 ENCOUNTER — Encounter: Payer: Self-pay | Admitting: Family Medicine

## 2013-05-22 ENCOUNTER — Ambulatory Visit (INDEPENDENT_AMBULATORY_CARE_PROVIDER_SITE_OTHER): Payer: 59 | Admitting: Family Medicine

## 2013-05-22 VITALS — BP 112/64 | HR 60 | Temp 97.8°F | Wt 264.0 lb

## 2013-05-22 DIAGNOSIS — E1165 Type 2 diabetes mellitus with hyperglycemia: Secondary | ICD-10-CM

## 2013-05-22 DIAGNOSIS — IMO0001 Reserved for inherently not codable concepts without codable children: Secondary | ICD-10-CM

## 2013-05-22 DIAGNOSIS — IMO0002 Reserved for concepts with insufficient information to code with codable children: Secondary | ICD-10-CM

## 2013-05-22 MED ORDER — LEVOTHYROXINE SODIUM 112 MCG PO TABS: 112.0000 ug | ORAL_TABLET | Freq: Every day | ORAL | Status: DC

## 2013-05-22 NOTE — Progress Notes (Signed)
  Subjective:    Patient ID: John Frye, male    DOB: 06-17-1956, 57 y.o.   MRN: [id number]  HPI Followup type 2 diabetes. We started metformin a few months ago -currently taking 500 mg twice daily. Initial A1c 9.3%. He's made some dietary changes and has lost about 13 pounds. He is still not exercising consistently. Overall feels improved. No symptoms of polyuria or polydipsia. A1c now 7.3%. He plans to increase his exercise soon.  Patient also has history of hypothyroidism treated with levothyroxine. Requesting refills. Recent TSH normal.  Past Medical History  Diagnosis Date  . Dermatophytosis of nail 05/08/2008  . [place] DISEASE 05/08/2008  . HYPOTHYROIDISM 10/10/2009  . OBSTRUCTIVE SLEEP APNEA 05/08/2008  . ALLERGIC RHINITIS 05/12/2008  . SHOULDER PAIN, BILATERAL 10/10/2009  . BACK PAIN 05/08/2008  . PLANTAR FASCIITIS 05/08/2008  . INSOMNIA 06/17/2010  . PREDIABETES 09/18/2010   Past Surgical History  Procedure Laterality Date  . Tonsillectomy      reports that he has never smoked. He does not have any smokeless tobacco history on file. His alcohol and drug histories are not on file. family history includes Stroke (age of onset: 22) in his father. No Known Allergies    Review of Systems  Constitutional: Negative for appetite change.  Respiratory: Negative for cough and shortness of breath.   Cardiovascular: Negative for chest pain.  Gastrointestinal: Negative for abdominal pain.  Endocrine: Negative for polydipsia and polyuria.       Objective:   Physical Exam  Constitutional: He appears well-developed and well-nourished.  Cardiovascular: Normal rate and regular rhythm.   Pulmonary/Chest: Effort normal and breath sounds normal. No respiratory distress. He has no wheezes. He has no rales.  Musculoskeletal: He exhibits no edema.          Assessment & Plan:  Type 2 diabetes. Improved. We discussed options. He prefers continued lifestyle management and recheck A1c in 3  months. If not further to goal at that point titrate metformin further. Set up eye exam at some point this year

## 2013-06-26 ENCOUNTER — Other Ambulatory Visit: Payer: Self-pay | Admitting: Family Medicine

## 2013-06-30 ENCOUNTER — Ambulatory Visit (INDEPENDENT_AMBULATORY_CARE_PROVIDER_SITE_OTHER): Payer: 59 | Admitting: Family Medicine

## 2013-06-30 ENCOUNTER — Encounter: Payer: Self-pay | Admitting: Family Medicine

## 2013-06-30 VITALS — BP 102/68 | HR 67 | Temp 97.6°F | Wt 266.0 lb

## 2013-06-30 DIAGNOSIS — Z23 Encounter for immunization: Secondary | ICD-10-CM

## 2013-06-30 DIAGNOSIS — M545 Low back pain, unspecified: Secondary | ICD-10-CM

## 2013-06-30 MED ORDER — PREDNISONE 10 MG PO TABS: ORAL_TABLET | ORAL | Status: DC

## 2013-06-30 NOTE — Progress Notes (Signed)
  Subjective:    Patient ID: John Frye, male    DOB: November 05, 1955, 57 y.o.   MRN: [id number]  HPI Acute visit for low back pain Patient denies specific injury but was doing some car work last week. Onset 6 days ago of right lumbar back pain. Radiation to buttock and occasionally down thigh. He had increased stiffness. Flexeril helps somewhat. He's also taking some ibuprofen. Patient had similar episode back in 2008.  MRI scan at that time which showed disc degeneration L4-L5 and L5-S1 Current pain is 4/10 severity. Worse with movement. Overall improved compared to 4-5 days ago. No urine or stool incontinence. No significant numbness other than some chronic mild numbness right anterior thigh which is unchanged  Past Medical History  Diagnosis Date  . Dermatophytosis of nail 05/08/2008  . [place] DISEASE 05/08/2008  . HYPOTHYROIDISM 10/10/2009  . OBSTRUCTIVE SLEEP APNEA 05/08/2008  . ALLERGIC RHINITIS 05/12/2008  . SHOULDER PAIN, BILATERAL 10/10/2009  . BACK PAIN 05/08/2008  . PLANTAR FASCIITIS 05/08/2008  . INSOMNIA 06/17/2010  . PREDIABETES 09/18/2010   Past Surgical History  Procedure Laterality Date  . Tonsillectomy      reports that he has never smoked. He does not have any smokeless tobacco history on file. His alcohol and drug histories are not on file. family history includes Stroke (age of onset: 52) in his father. No Known Allergies    Review of Systems  Constitutional: Negative for fever, activity change and appetite change.  Respiratory: Negative for cough and shortness of breath.   Cardiovascular: Negative for chest pain and leg swelling.  Gastrointestinal: Negative for vomiting and abdominal pain.  Genitourinary: Negative for dysuria, hematuria and flank pain.  Musculoskeletal: Positive for back pain. Negative for joint swelling.  Neurological: Negative for weakness and numbness.       Objective:   Physical Exam  Constitutional: He appears well-developed and  well-nourished.  Cardiovascular: Normal rate and regular rhythm.   Pulmonary/Chest: Effort normal and breath sounds normal. No respiratory distress. He has no wheezes. He has no rales.  Musculoskeletal: He exhibits no edema.  Straight leg raise are negative.  Neurological:  Deep tender reflexes are 2+ knee and ankle bilaterally. No focal strength deficits.          Assessment & Plan:  Right lumbar back pain with radiculopathy symptoms. Nonfocal neurologic exam. Prednisone taper. Continue Flexeril as needed. Continue ibuprofen. Touch base next week if not improving

## 2013-07-05 ENCOUNTER — Other Ambulatory Visit: Payer: Self-pay | Admitting: Family Medicine

## 2013-08-02 ENCOUNTER — Other Ambulatory Visit: Payer: Self-pay | Admitting: Family Medicine

## 2013-08-16 ENCOUNTER — Other Ambulatory Visit: Payer: 59

## 2013-08-17 ENCOUNTER — Other Ambulatory Visit (INDEPENDENT_AMBULATORY_CARE_PROVIDER_SITE_OTHER): Payer: 59

## 2013-08-17 DIAGNOSIS — E1165 Type 2 diabetes mellitus with hyperglycemia: Secondary | ICD-10-CM

## 2013-08-17 DIAGNOSIS — IMO0002 Reserved for concepts with insufficient information to code with codable children: Secondary | ICD-10-CM

## 2013-08-17 DIAGNOSIS — IMO0001 Reserved for inherently not codable concepts without codable children: Secondary | ICD-10-CM

## 2013-08-17 LAB — MICROALBUMIN / CREATININE URINE RATIO
Creatinine,U: 119.2 mg/dL
Microalb Creat Ratio: 0.6 mg/g (ref 0.0–30.0)
Microalb, Ur: 0.7 mg/dL (ref 0.0–1.9)

## 2013-08-17 LAB — HEMOGLOBIN A1C: Hgb A1c MFr Bld: 6.7 % — ABNORMAL HIGH (ref 4.6–6.5)

## 2013-08-18 ENCOUNTER — Telehealth: Payer: Self-pay | Admitting: Family Medicine

## 2013-08-18 NOTE — Telephone Encounter (Signed)
Pt returned your call about results

## 2013-08-21 NOTE — Telephone Encounter (Signed)
Pt informed

## 2013-08-23 ENCOUNTER — Ambulatory Visit (INDEPENDENT_AMBULATORY_CARE_PROVIDER_SITE_OTHER): Payer: 59 | Admitting: Family Medicine

## 2013-08-23 ENCOUNTER — Encounter: Payer: Self-pay | Admitting: Family Medicine

## 2013-08-23 ENCOUNTER — Ambulatory Visit: Payer: 59 | Admitting: Family Medicine

## 2013-08-23 VITALS — BP 110/68 | HR 72 | Temp 98.1°F | Wt 260.0 lb

## 2013-08-23 DIAGNOSIS — IMO0002 Reserved for concepts with insufficient information to code with codable children: Secondary | ICD-10-CM

## 2013-08-23 DIAGNOSIS — IMO0001 Reserved for inherently not codable concepts without codable children: Secondary | ICD-10-CM

## 2013-08-23 DIAGNOSIS — B351 Tinea unguium: Secondary | ICD-10-CM

## 2013-08-23 DIAGNOSIS — Q828 Other specified congenital malformations of skin: Secondary | ICD-10-CM

## 2013-08-23 DIAGNOSIS — E1165 Type 2 diabetes mellitus with hyperglycemia: Secondary | ICD-10-CM

## 2013-08-23 NOTE — Progress Notes (Signed)
  Subjective:    Patient ID: John Frye, male    DOB: [birth year], 57 y.o.   MRN: [id number]  HPI Followup type 2 diabetes  He has shown steady improvement and overall has lost about 18 pounds since last spring. Recent A1c 6.7% after starting around 9.3%. he's been a great job with dietary modification. Remains on metformin 500 mg twice a day. No symptoms of hyperglycemia. Recent urine microalbumin screen negative  He's had history of onychomycosis and apparently has been taking Lamisil over 5 months. His nails are greatly improved. We have advised him to discontinue Lamisil at this time.  He has multiple skin tags around his neck which are frequently irritating because of clothing. He is requesting treatment with liquid nitrogen.  Past Medical History  Diagnosis Date  . Dermatophytosis of nail 05/08/2008  . [place] DISEASE 05/08/2008  . HYPOTHYROIDISM 10/10/2009  . OBSTRUCTIVE SLEEP APNEA 05/08/2008  . ALLERGIC RHINITIS 05/12/2008  . SHOULDER PAIN, BILATERAL 10/10/2009  . BACK PAIN 05/08/2008  . PLANTAR FASCIITIS 05/08/2008  . INSOMNIA 06/17/2010  . PREDIABETES 09/18/2010   Past Surgical History  Procedure Laterality Date  . Tonsillectomy      reports that he has never smoked. He does not have any smokeless tobacco history on file. His alcohol and drug histories are not on file. family history includes Stroke (age of onset: 78) in his father. No Known Allergies    Review of Systems  Constitutional: Negative for fatigue and unexpected weight change.  Eyes: Negative for visual disturbance.  Respiratory: Negative for cough, chest tightness and shortness of breath.   Cardiovascular: Negative for chest pain, palpitations and leg swelling.  Endocrine: Negative for polydipsia and polyuria.  Neurological: Negative for dizziness, syncope, weakness, light-headedness and headaches.       Objective:   Physical Exam  Constitutional: He appears well-developed and well-nourished.   Cardiovascular: Normal rate and regular rhythm.   Pulmonary/Chest: Effort normal and breath sounds normal. No respiratory distress. He has no wheezes. He has no rales.  Skin:  Right great toenail is improving with healthy appearing nail about halfway up down to the base  Patient has multiple scattered benign appearing skin tags right and left neck.          Assessment & Plan:  #1 type 2 diabetes. Improved. Continue weight loss and exercise efforts. Repeat A1c in 3 months. Continue metformin #2 onychomycosis toenails. Improved. Discontinue Lamisil this time #3 multiple benign appearing skin tags irritated because of clothing. Discussed risk and benefits of treatment with liquid nitrogen and patient consents. We treated 4 skin tags right side of neck and 2 left side of neck with liquid nitrogen and patient tolerated well

## 2013-08-23 NOTE — Patient Instructions (Signed)
Stop terbinafine

## 2013-08-23 NOTE — Progress Notes (Signed)
Pre visit review using our clinic review tool, if applicable. No additional management support is needed unless otherwise documented below in the visit note. 

## 2014-01-01 ENCOUNTER — Telehealth: Payer: Self-pay | Admitting: Family Medicine

## 2014-01-01 NOTE — Telephone Encounter (Signed)
Pt is wanting to speak with dr.burchette regarding his machine for his sleep study, states the machine makes a loud noise and he wants to know if he can get a order for a new machine.

## 2014-01-01 NOTE — Telephone Encounter (Signed)
Left message on patient VM that he would need to call the office that he got his machine from. Most all machines make noise.

## 2014-02-22 ENCOUNTER — Other Ambulatory Visit (INDEPENDENT_AMBULATORY_CARE_PROVIDER_SITE_OTHER): Payer: 59

## 2014-02-22 DIAGNOSIS — IMO0002 Reserved for concepts with insufficient information to code with codable children: Secondary | ICD-10-CM

## 2014-02-22 DIAGNOSIS — IMO0001 Reserved for inherently not codable concepts without codable children: Secondary | ICD-10-CM

## 2014-02-22 DIAGNOSIS — E1165 Type 2 diabetes mellitus with hyperglycemia: Secondary | ICD-10-CM

## 2014-02-22 DIAGNOSIS — Z0000 Encounter for general adult medical examination without abnormal findings: Secondary | ICD-10-CM

## 2014-02-22 LAB — BASIC METABOLIC PANEL
BUN: 15 mg/dL (ref 6–23)
CO2: 29 mEq/L (ref 19–32)
Calcium: 9.5 mg/dL (ref 8.4–10.5)
Chloride: 100 mEq/L (ref 96–112)
Creatinine, Ser: 0.8 mg/dL (ref 0.4–1.5)
GFR: 111.11 mL/min (ref >60.00)
Glucose, Bld: 137 mg/dL — ABNORMAL HIGH (ref 70–99)
Potassium: 4.3 mEq/L (ref 3.5–5.1)
Sodium: 137 mEq/L (ref 135–145)

## 2014-02-22 LAB — POCT URINALYSIS DIPSTICK
Bilirubin, UA: NEGATIVE
Blood, UA: NEGATIVE
Glucose, UA: NEGATIVE
Ketones, UA: NEGATIVE
Leukocytes, UA: NEGATIVE
Nitrite, UA: NEGATIVE
Protein, UA: NEGATIVE
Spec Grav, UA: 1.01
Urobilinogen, UA: 0.2
pH, UA: 8

## 2014-02-22 LAB — CBC WITH DIFFERENTIAL/PLATELET
Basophils Absolute: 0 10*3/uL (ref 0.0–0.1)
Basophils Relative: 0.4 % (ref 0.0–3.0)
Eosinophils Absolute: 0.1 10*3/uL (ref 0.0–0.7)
Eosinophils Relative: 1.5 % (ref 0.0–5.0)
HCT: 42.2 % (ref 39.0–52.0)
Hemoglobin: 14.1 g/dL (ref 13.0–17.0)
Lymphocytes Relative: 34.5 % (ref 12.0–46.0)
Lymphs Abs: 2.3 10*3/uL (ref 0.7–4.0)
MCHC: 33.5 g/dL (ref 30.0–36.0)
MCV: 89.3 fl (ref 78.0–100.0)
Monocytes Absolute: 0.5 10*3/uL (ref 0.1–1.0)
Monocytes Relative: 7.1 % (ref 3.0–12.0)
Neutro Abs: 3.8 10*3/uL (ref 1.4–7.7)
Neutrophils Relative %: 56.5 % (ref 43.0–77.0)
Platelets: 290 10*3/uL (ref 150.0–400.0)
RBC: 4.73 Mil/uL (ref 4.22–5.81)
RDW: 13.9 % (ref 11.5–15.5)
WBC: 6.8 10*3/uL (ref 4.0–10.5)

## 2014-02-22 LAB — HEPATIC FUNCTION PANEL
ALT: 21 U/L (ref 0–53)
AST: 18 U/L (ref 0–37)
Albumin: 4.2 g/dL (ref 3.5–5.2)
Alkaline Phosphatase: 56 U/L (ref 39–117)
Bilirubin, Direct: 0.1 mg/dL (ref 0.0–0.3)
Total Bilirubin: 0.5 mg/dL (ref 0.2–1.2)
Total Protein: 6.7 g/dL (ref 6.0–8.3)

## 2014-02-22 LAB — LIPID PANEL
Cholesterol: 173 mg/dL (ref 0–200)
HDL: 44.9 mg/dL (ref >39.00)
LDL Cholesterol: 113 mg/dL — ABNORMAL HIGH (ref 0–99)
Total CHOL/HDL Ratio: 4
Triglycerides: 77 mg/dL (ref 0.0–149.0)
VLDL: 15.4 mg/dL (ref 0.0–40.0)

## 2014-02-22 LAB — TSH: TSH: 3.58 u[IU]/mL (ref 0.35–4.50)

## 2014-02-22 LAB — MICROALBUMIN / CREATININE URINE RATIO
Creatinine,U: 18.3 mg/dL
Microalb Creat Ratio: 1.1 mg/g (ref 0.0–30.0)
Microalb, Ur: 0.2 mg/dL (ref 0.0–1.9)

## 2014-02-22 LAB — HEMOGLOBIN A1C: Hgb A1c MFr Bld: 6.6 % — ABNORMAL HIGH (ref 4.6–6.5)

## 2014-02-22 LAB — PSA: PSA: 0.36 ng/mL (ref 0.10–4.00)

## 2014-02-22 NOTE — Addendum Note (Signed)
Addended by: Elmer Picker on: 02/22/2014 10:11 AM   Modules accepted: Orders

## 2014-03-01 ENCOUNTER — Encounter: Payer: Self-pay | Admitting: Family Medicine

## 2014-03-01 ENCOUNTER — Ambulatory Visit (INDEPENDENT_AMBULATORY_CARE_PROVIDER_SITE_OTHER): Payer: 59 | Admitting: Family Medicine

## 2014-03-01 VITALS — BP 112/76 | HR 65 | Temp 98.4°F | Ht 74.0 in | Wt 272.0 lb

## 2014-03-01 DIAGNOSIS — Z0000 Encounter for general adult medical examination without abnormal findings: Secondary | ICD-10-CM

## 2014-03-01 DIAGNOSIS — G4733 Obstructive sleep apnea (adult) (pediatric): Secondary | ICD-10-CM

## 2014-03-01 NOTE — Patient Instructions (Signed)
Try to lose some weight and establish more consistent exercise.   

## 2014-03-01 NOTE — Progress Notes (Signed)
   Subjective:    Patient ID: John Frye, male    DOB: 15-Jul-1956, 58 y.o.   MRN: [id number]  HPI Patient seen for complete physical. He has problems including obesity, type 2 diabetes, hypothyroidism, obstructive sleep apnea. He is requesting referral for new CPAP machine and equipment. His current equipment is several years old. He's had some recent weight gain. Poor compliance with exercise and diet. Medications reviewed. Remains on metformin. Blood sugars not monitoring regularly. Recent A1c 6.6%. Nonsmoker.  Past Medical History  Diagnosis Date  . Dermatophytosis of nail 05/08/2008  . [place] DISEASE 05/08/2008  . HYPOTHYROIDISM 10/10/2009  . OBSTRUCTIVE SLEEP APNEA 05/08/2008  . ALLERGIC RHINITIS 05/12/2008  . SHOULDER PAIN, BILATERAL 10/10/2009  . BACK PAIN 05/08/2008  . PLANTAR FASCIITIS 05/08/2008  . INSOMNIA 06/17/2010  . PREDIABETES 09/18/2010   Past Surgical History  Procedure Laterality Date  . Tonsillectomy      reports that he has never smoked. He does not have any smokeless tobacco history on file. His alcohol and drug histories are not on file. family history includes Stroke (age of onset: 68) in his father. No Known Allergies    Review of Systems  Constitutional: Positive for fatigue. Negative for fever, activity change and appetite change.  HENT: Negative for congestion, ear pain and trouble swallowing.   Eyes: Negative for pain and visual disturbance.  Respiratory: Negative for cough, shortness of breath and wheezing.   Cardiovascular: Negative for chest pain and palpitations.  Gastrointestinal: Negative for nausea, vomiting, abdominal pain, diarrhea, constipation, blood in stool, abdominal distention and rectal pain.  Genitourinary: Negative for dysuria, hematuria and testicular pain.  Musculoskeletal: Negative for arthralgias and joint swelling.  Skin: Negative for rash.  Neurological: Negative for dizziness, syncope and headaches.  Hematological: Negative for  adenopathy.  Psychiatric/Behavioral: Negative for confusion and dysphoric mood.       Objective:   Physical Exam  Constitutional: He is oriented to person, place, and time. He appears well-developed and well-nourished. No distress.  HENT:  Head: Normocephalic and atraumatic.  Right Ear: External ear normal.  Left Ear: External ear normal.  Mouth/Throat: Oropharynx is clear and moist.  Eyes: Conjunctivae and EOM are normal. Pupils are equal, round, and reactive to light.  Neck: Normal range of motion. Neck supple. No thyromegaly present.  Cardiovascular: Normal rate, regular rhythm and normal heart sounds.   No murmur heard. Pulmonary/Chest: No respiratory distress. He has no wheezes. He has no rales.  Abdominal: Soft. Bowel sounds are normal. He exhibits no distension and no mass. There is no tenderness. There is no rebound and no guarding.  Musculoskeletal: He exhibits no edema.  Lymphadenopathy:    He has no cervical adenopathy.  Neurological: He is alert and oriented to person, place, and time. He displays normal reflexes. No cranial nerve deficit.  Skin: No rash noted.  Patient has several small scattered nevi but no atypical features  Psychiatric: He has a normal mood and affect.          Assessment & Plan:  #1 health maintenance. We had a long discussion regarding importance establishing more consistent exercise and weight loss. Labs reviewed. A1c remains well controlled. Immunizations up-to-date. Colonoscopy up to date #2 obstructive sleep apnea. Recommend weight loss. Set up referral to evaluate for new CPAP equipment

## 2014-03-01 NOTE — Progress Notes (Signed)
Pre visit review using our clinic review tool, if applicable. No additional management support is needed unless otherwise documented below in the visit note. 

## 2014-03-07 ENCOUNTER — Telehealth: Payer: Self-pay | Admitting: Family Medicine

## 2014-03-07 NOTE — Telephone Encounter (Signed)
Pt wife called  to follow on a rx to [facility] about a CPAC machine and sleep study has to go with the machine [phone]

## 2014-03-07 NOTE — Telephone Encounter (Signed)
We put in for referral to [facility] (per family request) and this referral has been made.  We need to check to see why they have not gotten and referral yet.

## 2014-03-07 NOTE — Telephone Encounter (Signed)
The referrral is ordered. Melissa stated that she needed more information bout the patient.

## 2014-03-13 ENCOUNTER — Telehealth: Payer: Self-pay | Admitting: Family Medicine

## 2014-03-13 NOTE — Telephone Encounter (Signed)
Pt wife brought sleep study results into doc burchette and still waiting for cpap rx to be fax to [facility] [phone],

## 2014-03-13 NOTE — Telephone Encounter (Signed)
Paper is on your desk to fill out

## 2014-03-14 NOTE — Telephone Encounter (Signed)
Completed.

## 2014-03-14 NOTE — Telephone Encounter (Signed)
Form was given to [facility]

## 2014-03-19 ENCOUNTER — Other Ambulatory Visit: Payer: Self-pay | Admitting: Family Medicine

## 2014-03-21 ENCOUNTER — Telehealth: Payer: Self-pay | Admitting: Family Medicine

## 2014-03-21 MED ORDER — LEVOTHYROXINE SODIUM 112 MCG PO TABS: 112.0000 ug | ORAL_TABLET | Freq: Every day | ORAL | Status: DC

## 2014-03-21 NOTE — Telephone Encounter (Signed)
Rx sent to mail order

## 2014-03-21 NOTE — Telephone Encounter (Signed)
Pt request refill of  levothyroxine (SYNTHROID, LEVOTHROID) 112 MCG tablet  90 day w/ 3 refills Pt had cpe  on  5/28 and forgot to tell the doc.

## 2014-03-26 ENCOUNTER — Telehealth: Payer: Self-pay | Admitting: Family Medicine

## 2014-03-26 NOTE — Telephone Encounter (Signed)
(  F) [phone]  Ellise states pt's insurance requires copy of Sleep Study and any office notes prior to sleep study referencing the sleep study. And she also needs to verify if the pt needs a new C-Pap machine.

## 2014-03-26 NOTE — Telephone Encounter (Signed)
Faxed notes over to [facility]

## 2014-03-27 ENCOUNTER — Telehealth: Payer: Self-pay | Admitting: Family Medicine

## 2014-03-27 NOTE — Telephone Encounter (Signed)
Pt states his sleep study is on file, and he is needing the rx for new cpap sent to [facility] [phone].

## 2014-03-28 NOTE — Telephone Encounter (Signed)
Faxed Rx to home care

## 2014-04-02 ENCOUNTER — Encounter: Payer: Self-pay | Admitting: Family Medicine

## 2014-04-02 ENCOUNTER — Ambulatory Visit (INDEPENDENT_AMBULATORY_CARE_PROVIDER_SITE_OTHER): Payer: 59 | Admitting: Family Medicine

## 2014-04-02 VITALS — BP 124/70 | HR 65 | Wt 279.0 lb

## 2014-04-02 DIAGNOSIS — E119 Type 2 diabetes mellitus without complications: Secondary | ICD-10-CM

## 2014-04-02 DIAGNOSIS — G4733 Obstructive sleep apnea (adult) (pediatric): Secondary | ICD-10-CM

## 2014-04-02 DIAGNOSIS — K921 Melena: Secondary | ICD-10-CM

## 2014-04-02 MED ORDER — HYDROCORTISONE ACETATE 25 MG RE SUPP: 25.0000 mg | Freq: Two times a day (BID) | RECTAL | Status: DC

## 2014-04-02 NOTE — Patient Instructions (Signed)

## 2014-04-02 NOTE — Progress Notes (Signed)
Pre visit review using our clinic review tool, if applicable. No additional management support is needed unless otherwise documented below in the visit note. 

## 2014-04-02 NOTE — Progress Notes (Signed)
   Subjective:    Patient ID: John Frye, male    DOB: 1955/11/30, 58 y.o.   MRN: [id number]  HPI Patient here for the following issues:  Obstructive sleep apnea. He has had CPAP for about 8 years now. He uses consistently each night and has had clinical improvement consistently with using CPAP. However, he is asking for machine replacement. He has a couple of issues with his current machine. One is that it is very noisy which some times wakes himself up but also frequently his spouse's had tremendous difficulties because of the noise. Also he has noted water into his nose pillow and frequently inhales excessive water nasally. His insurance company recently denied a new machine.  Type 2 diabetes. Recent improved control. Inconsistent exercise. No symptoms of hyperglycemia  Intermittent bright red blood per rectum. He had colonoscopy 2 years ago showed internal and external hemorrhoids. No pain with bowel movements. No constipation issues. No appetite or weight changes.  Past Medical History  Diagnosis Date  . Dermatophytosis of nail 05/08/2008  . [place] DISEASE 05/08/2008  . HYPOTHYROIDISM 10/10/2009  . OBSTRUCTIVE SLEEP APNEA 05/08/2008  . ALLERGIC RHINITIS 05/12/2008  . SHOULDER PAIN, BILATERAL 10/10/2009  . BACK PAIN 05/08/2008  . PLANTAR FASCIITIS 05/08/2008  . INSOMNIA 06/17/2010  . PREDIABETES 09/18/2010   Past Surgical History  Procedure Laterality Date  . Tonsillectomy      reports that he has never smoked. He does not have any smokeless tobacco history on file. His alcohol and drug histories are not on file. family history includes Stroke (age of onset: 67) in his father. No Known Allergies     Review of Systems  Constitutional: Negative for fatigue.  Eyes: Negative for visual disturbance.  Respiratory: Negative for cough, chest tightness and shortness of breath.   Cardiovascular: Negative for chest pain, palpitations and leg swelling.  Neurological: Negative for dizziness,  syncope, weakness, light-headedness and headaches.       Objective:   Physical Exam  Constitutional: He is oriented to person, place, and time. He appears well-developed and well-nourished.  HENT:  Right Ear: External ear normal.  Left Ear: External ear normal.  Mouth/Throat: Oropharynx is clear and moist.  Eyes: Pupils are equal, round, and reactive to light.  Neck: Neck supple. No thyromegaly present.  Cardiovascular: Normal rate and regular rhythm.   Pulmonary/Chest: Effort normal and breath sounds normal. No respiratory distress. He has no wheezes. He has no rales.  Musculoskeletal: He exhibits no edema.  Neurological: He is alert and oriented to person, place, and time.          Assessment & Plan:  #1 obstructive sleep apnea. Patient compliant with therapy and clinically improved with CPAP. Requesting new machine for reasons above.  He is working with [employer] to try to get new equipment. #2 hematochezia. Recent colonoscopy internal and external hemorrhoids. Suspect internal hemorrhoids most likely. Measures to reduce constipation. Hydrocortisone 25 mg suppository twice daily.  Refer back to Gi if not improved with the above. #3 type 2 diabetes. Recent improved control. Repeat A1c in 3-4 months

## 2014-04-03 ENCOUNTER — Telehealth: Payer: Self-pay | Admitting: Family Medicine

## 2014-04-03 NOTE — Telephone Encounter (Signed)
Left message for patient to return call.

## 2014-04-03 NOTE — Telephone Encounter (Signed)
Received a fax for CPAP but the pressure reading is needed on the script.  Pressure of 9 was suggested on the sleep study reading, if you can confirm with the pt that they are using that pressure, you can add that pressure to the order and re-send it to Attn: Dimas Chyle (f) [phone].

## 2014-04-05 NOTE — Telephone Encounter (Signed)
Spoke with Leafy Ro with [employer] and she stated that [facility] is requiring a peer to peer.  [phone]  Authorization # [id number]

## 2014-04-09 ENCOUNTER — Telehealth: Payer: Self-pay | Admitting: Family Medicine

## 2014-04-09 NOTE — Telephone Encounter (Signed)
Called and gave Leafy Ro the information. [phone]

## 2014-04-09 NOTE — Telephone Encounter (Signed)
error 

## 2014-04-09 NOTE — Telephone Encounter (Signed)
Please re-submit order with pressure setting of 9 mm H20.

## 2014-04-09 NOTE — Telephone Encounter (Signed)
Called and left message on John Frye VM. That [facility] is taking care of the C-Pap order.

## 2014-04-09 NOTE — Telephone Encounter (Signed)
John Frye from [facility] [phone] wants to know if [facility] is now handling pt's C-Pap order.  The original call on 04/03/14 came from [facility] but the information was given to [place].  Please advise John Frye if they need to close the order or not.

## 2014-06-08 ENCOUNTER — Telehealth: Payer: Self-pay | Admitting: Family Medicine

## 2014-06-08 NOTE — Telephone Encounter (Signed)
Pt is calling back regarding cpap machine

## 2014-06-08 NOTE — Telephone Encounter (Signed)
Pt informed that we have not received any paperwork on his cpap.

## 2014-10-14 ENCOUNTER — Other Ambulatory Visit: Payer: Self-pay | Admitting: Family Medicine

## 2015-02-27 ENCOUNTER — Ambulatory Visit (INDEPENDENT_AMBULATORY_CARE_PROVIDER_SITE_OTHER): Payer: 59 | Admitting: Family Medicine

## 2015-02-27 ENCOUNTER — Encounter: Payer: Self-pay | Admitting: Family Medicine

## 2015-02-27 VITALS — BP 104/80 | HR 81 | Temp 98.4°F | Ht 74.0 in | Wt 273.8 lb

## 2015-02-27 DIAGNOSIS — E119 Type 2 diabetes mellitus without complications: Secondary | ICD-10-CM

## 2015-02-27 DIAGNOSIS — R5383 Other fatigue: Secondary | ICD-10-CM

## 2015-02-27 DIAGNOSIS — E038 Other specified hypothyroidism: Secondary | ICD-10-CM | POA: Diagnosis not present

## 2015-02-27 DIAGNOSIS — G4733 Obstructive sleep apnea (adult) (pediatric): Secondary | ICD-10-CM

## 2015-02-27 NOTE — Progress Notes (Signed)
   Subjective:    Patient ID: John Frye, male    DOB: April 25, 1956, 59 y.o.   MRN: [id number]  HPI Patient here with complaints of fatigue. His chronic problems include history of obesity, hypothyroidism, obstructive sleep apnea, type 2 diabetes. No labs in 1 year. Infrequently checks blood sugars. Fasting blood sugar this morning 176. Sleep apnea diagnosed around 2008. He uses CPAP but thinks he may be having some equipment malfunction. His machine is apparently 57 years old.  Frequent daytime somnolence. Progressive fatigue over several months. No dietary changes. No appetite or weight changes. No dyspnea other than with exertion. No chest pains. Compliant with thyroid medication. Denies depressive symptoms.  Past Medical History  Diagnosis Date  . Dermatophytosis of nail 05/08/2008  . [place] DISEASE 05/08/2008  . HYPOTHYROIDISM 10/10/2009  . OBSTRUCTIVE SLEEP APNEA 05/08/2008  . ALLERGIC RHINITIS 05/12/2008  . SHOULDER PAIN, BILATERAL 10/10/2009  . BACK PAIN 05/08/2008  . PLANTAR FASCIITIS 05/08/2008  . INSOMNIA 06/17/2010  . PREDIABETES 09/18/2010   Past Surgical History  Procedure Laterality Date  . Tonsillectomy      reports that he has never smoked. He does not have any smokeless tobacco history on file. His alcohol and drug histories are not on file. family history includes Stroke (age of onset: 85) in his father. No Known Allergies    Review of Systems  Constitutional: Positive for fatigue. Negative for fever, chills, appetite change and unexpected weight change.  Respiratory: Negative for cough and shortness of breath.   Cardiovascular: Negative for chest pain.  Gastrointestinal: Negative for nausea, vomiting, abdominal pain and diarrhea.  Genitourinary: Negative for dysuria.  Neurological: Negative for dizziness and weakness.  Hematological: Negative for adenopathy.  Psychiatric/Behavioral: Negative for dysphoric mood.       Objective:   Physical Exam  Constitutional: He is  oriented to person, place, and time. He appears well-developed and well-nourished.  Neck: Neck supple. No thyromegaly present.  Cardiovascular: Normal rate and regular rhythm.   No murmur heard. Pulmonary/Chest: Effort normal and breath sounds normal. No respiratory distress. He has no wheezes. He has no rales.  Musculoskeletal: He exhibits no edema.  Neurological: He is alert and oriented to person, place, and time.          Assessment & Plan:  #1 fatigue. Multiple possible etiologies. Obstructive sleep apnea possibly not currently well controlled. Epworth sleepiness scale 17.  ? Low testosterone. ? Undertreated thyroid- return for fasting labs including CBC, chemistries, TSH, testosterone level. If all above normal consider referral to pulmonary for further evaluation management of obstructive sleep apnea #2 hypothyroidism. Schedule TSH  #3 type 2 diabetes. No A1c in 1 year. Repeat A1c with upcoming labs

## 2015-02-27 NOTE — Progress Notes (Signed)
Pre visit review using our clinic review tool, if applicable. No additional management support is needed unless otherwise documented below in the visit note. 

## 2015-03-12 ENCOUNTER — Other Ambulatory Visit (INDEPENDENT_AMBULATORY_CARE_PROVIDER_SITE_OTHER): Payer: 59

## 2015-03-12 DIAGNOSIS — R5383 Other fatigue: Secondary | ICD-10-CM

## 2015-03-12 DIAGNOSIS — E038 Other specified hypothyroidism: Secondary | ICD-10-CM

## 2015-03-12 DIAGNOSIS — E119 Type 2 diabetes mellitus without complications: Secondary | ICD-10-CM

## 2015-03-12 LAB — BASIC METABOLIC PANEL
BUN: 14 mg/dL (ref 6–23)
CO2: 29 mEq/L (ref 19–32)
Calcium: 8.8 mg/dL (ref 8.4–10.5)
Chloride: 102 mEq/L (ref 96–112)
Creatinine, Ser: 0.75 mg/dL (ref 0.40–1.50)
GFR: 114.11 mL/min (ref >60.00)
Glucose, Bld: 174 mg/dL — ABNORMAL HIGH (ref 70–99)
Potassium: 4.2 mEq/L (ref 3.5–5.1)
Sodium: 135 mEq/L (ref 135–145)

## 2015-03-12 LAB — CBC WITH DIFFERENTIAL/PLATELET
Basophils Absolute: 0 10*3/uL (ref 0.0–0.1)
Basophils Relative: 0.5 % (ref 0.0–3.0)
Eosinophils Absolute: 0.1 10*3/uL (ref 0.0–0.7)
Eosinophils Relative: 1.4 % (ref 0.0–5.0)
HCT: 42.2 % (ref 39.0–52.0)
Hemoglobin: 14.3 g/dL (ref 13.0–17.0)
Lymphocytes Relative: 31.3 % (ref 12.0–46.0)
Lymphs Abs: 2.5 10*3/uL (ref 0.7–4.0)
MCHC: 33.8 g/dL (ref 30.0–36.0)
MCV: 87.9 fl (ref 78.0–100.0)
Monocytes Absolute: 0.6 10*3/uL (ref 0.1–1.0)
Monocytes Relative: 7.5 % (ref 3.0–12.0)
Neutro Abs: 4.7 10*3/uL (ref 1.4–7.7)
Neutrophils Relative %: 59.3 % (ref 43.0–77.0)
Platelets: 287 10*3/uL (ref 150.0–400.0)
RBC: 4.8 Mil/uL (ref 4.22–5.81)
RDW: 13.4 % (ref 11.5–15.5)
WBC: 7.9 10*3/uL (ref 4.0–10.5)

## 2015-03-12 LAB — HEPATIC FUNCTION PANEL
ALT: 18 U/L (ref 0–53)
AST: 12 U/L (ref 0–37)
Albumin: 4.2 g/dL (ref 3.5–5.2)
Alkaline Phosphatase: 65 U/L (ref 39–117)
Bilirubin, Direct: 0.1 mg/dL (ref 0.0–0.3)
Total Bilirubin: 0.5 mg/dL (ref 0.2–1.2)
Total Protein: 6.6 g/dL (ref 6.0–8.3)

## 2015-03-12 LAB — LIPID PANEL
Cholesterol: 155 mg/dL (ref 0–200)
HDL: 40.3 mg/dL (ref >39.00)
LDL Cholesterol: 99 mg/dL (ref 0–99)
NonHDL: 114.7
Total CHOL/HDL Ratio: 4
Triglycerides: 77 mg/dL (ref 0.0–149.0)
VLDL: 15.4 mg/dL (ref 0.0–40.0)

## 2015-03-12 LAB — TESTOSTERONE: Testosterone: 280.67 ng/dL — ABNORMAL LOW (ref 300.00–890.00)

## 2015-03-12 LAB — HEMOGLOBIN A1C: Hgb A1c MFr Bld: 7 % — ABNORMAL HIGH (ref 4.6–6.5)

## 2015-03-12 LAB — TSH: TSH: 2.18 u[IU]/mL (ref 0.35–4.50)

## 2015-03-19 ENCOUNTER — Other Ambulatory Visit: Payer: Self-pay | Admitting: Family Medicine

## 2015-05-07 ENCOUNTER — Other Ambulatory Visit: Payer: Self-pay | Admitting: Family Medicine

## 2015-05-07 ENCOUNTER — Telehealth: Payer: Self-pay | Admitting: *Deleted

## 2015-05-07 NOTE — Telephone Encounter (Signed)
Patient is requesting a refill of lamisil 250 mg take 1 tab daily.  I do not see that on the current medication list.  Is this okay to fill? [facility]

## 2015-05-08 MED ORDER — TERBINAFINE HCL 250 MG PO TABS: 250.0000 mg | ORAL_TABLET | Freq: Every day | ORAL | Status: DC

## 2015-05-08 NOTE — Telephone Encounter (Signed)
Recent hepatic OK, so can start Lamisil 250 mg once daily for 3 months with no refills #90.

## 2015-05-08 NOTE — Telephone Encounter (Signed)
Rx sent to pharmacy   

## 2015-06-20 ENCOUNTER — Encounter: Payer: Self-pay | Admitting: Family Medicine

## 2015-06-20 ENCOUNTER — Ambulatory Visit (INDEPENDENT_AMBULATORY_CARE_PROVIDER_SITE_OTHER): Payer: 59 | Admitting: Family Medicine

## 2015-06-20 VITALS — BP 110/80 | HR 75 | Temp 97.9°F | Ht 74.0 in | Wt 276.4 lb

## 2015-06-20 DIAGNOSIS — R208 Other disturbances of skin sensation: Secondary | ICD-10-CM

## 2015-06-20 DIAGNOSIS — E119 Type 2 diabetes mellitus without complications: Secondary | ICD-10-CM

## 2015-06-20 DIAGNOSIS — R7989 Other specified abnormal findings of blood chemistry: Secondary | ICD-10-CM | POA: Insufficient documentation

## 2015-06-20 DIAGNOSIS — E291 Testicular hypofunction: Secondary | ICD-10-CM | POA: Diagnosis not present

## 2015-06-20 DIAGNOSIS — Z23 Encounter for immunization: Secondary | ICD-10-CM | POA: Diagnosis not present

## 2015-06-20 NOTE — Progress Notes (Signed)
   Subjective:    Patient ID: John Frye, male    DOB: [birth year], 59 y.o.   MRN: [id number]  HPI  Patient seen for follow-up type 2 diabetes. He's had some recent tingling and burning in his lower extremities. No weakness. He apparently had some type of coverage issue with getting his metformin sent in time from [facility] and he was out for some time. His neighbor had some extra metformin which he took at same dosage. Patient states the metformin he took was 57 years old and seemed to not work as well as the prescription he had. He has had some recent fasting blood sugars up as high as 200. He is exercising some with walking. Weight relatively stable. Last A1c in June 7 0.0%. No prior history of neuropathy.  Past Medical History  Diagnosis Date  . Dermatophytosis of nail 05/08/2008  . [place] DISEASE 05/08/2008  . HYPOTHYROIDISM 10/10/2009  . OBSTRUCTIVE SLEEP APNEA 05/08/2008  . ALLERGIC RHINITIS 05/12/2008  . SHOULDER PAIN, BILATERAL 10/10/2009  . BACK PAIN 05/08/2008  . PLANTAR FASCIITIS 05/08/2008  . INSOMNIA 06/17/2010  . PREDIABETES 09/18/2010   Past Surgical History  Procedure Laterality Date  . Tonsillectomy      reports that he has never smoked. He does not have any smokeless tobacco history on file. His alcohol and drug histories are not on file. family history includes Stroke (age of onset: 37) in his father. No Known Allergies   Review of Systems  Constitutional: Negative for fever and chills.  Respiratory: Negative for cough and shortness of breath.   Cardiovascular: Negative for chest pain.  Musculoskeletal: Negative for back pain.  Neurological: Negative for weakness and numbness.       Objective:   Physical Exam  Constitutional: He appears well-developed and well-nourished. No distress.  Cardiovascular: Normal rate and regular rhythm.   Pulmonary/Chest: Effort normal and breath sounds normal. No respiratory distress. He has no wheezes. He has no rales.    Musculoskeletal: He exhibits no edema.  Skin:  Feet reveal no skin lesions. Good distal foot pulses. Good capillary refill. No calluses. Normal sensation with monofilament testing           Assessment & Plan:  Dysesthesias lower extremities. Suspect related to diabetic neuropathy. He does not have any impairment with monofilament testing. Repeat A1c. If this is elevating first task will be getting his A1c back under control. He does not feel symptoms are severe enough to treat with medication this time such as gabapentin or Lyrica. If A1c up consider further titration of metformin. He is encouraged to lose some weight.  Low testosterone. Repeat testosterone level by early morning specimen. He is leaning toward treatment if this is still low because of some fatigue issues

## 2015-06-20 NOTE — Progress Notes (Signed)
Pre visit review using our clinic review tool, if applicable. No additional management support is needed unless otherwise documented below in the visit note. 

## 2015-06-21 ENCOUNTER — Other Ambulatory Visit: Payer: 59

## 2015-06-21 LAB — HEMOGLOBIN A1C: Hgb A1c MFr Bld: 7.3 % — ABNORMAL HIGH (ref 4.6–6.5)

## 2015-06-21 LAB — TESTOSTERONE: Testosterone: 281.3 ng/dL — ABNORMAL LOW (ref 300.00–890.00)

## 2015-06-26 MED ORDER — TESTOSTERONE 20.25 MG/ACT (1.62%) TD GEL: 1.0000 | Freq: Every day | TRANSDERMAL | Status: DC

## 2015-06-26 MED ORDER — METFORMIN HCL 500 MG PO TABS: 1000.0000 mg | ORAL_TABLET | Freq: Two times a day (BID) | ORAL | Status: DC

## 2015-06-26 NOTE — Progress Notes (Signed)
Called the pt gave him the result as well called the pharmacy for his medications

## 2015-06-26 NOTE — Addendum Note (Signed)
Addended by: Ailene Rud E on: 06/26/2015 04:23 PM   Modules accepted: Orders

## 2015-06-27 ENCOUNTER — Other Ambulatory Visit: Payer: Self-pay | Admitting: *Deleted

## 2015-06-27 MED ORDER — TESTOSTERONE 20.25 MG/ACT (1.62%) TD GEL: TRANSDERMAL | Status: DC

## 2015-08-20 LAB — HM DIABETES EYE EXAM

## 2015-08-22 ENCOUNTER — Encounter: Payer: Self-pay | Admitting: Family Medicine

## 2015-08-29 ENCOUNTER — Other Ambulatory Visit: Payer: Self-pay | Admitting: Family Medicine

## 2015-10-27 ENCOUNTER — Other Ambulatory Visit: Payer: Self-pay | Admitting: Family Medicine

## 2015-12-22 ENCOUNTER — Other Ambulatory Visit: Payer: Self-pay | Admitting: Family Medicine

## 2016-01-08 ENCOUNTER — Ambulatory Visit (INDEPENDENT_AMBULATORY_CARE_PROVIDER_SITE_OTHER): Payer: 59 | Admitting: Family Medicine

## 2016-01-08 ENCOUNTER — Encounter: Payer: Self-pay | Admitting: Family Medicine

## 2016-01-08 DIAGNOSIS — E119 Type 2 diabetes mellitus without complications: Secondary | ICD-10-CM | POA: Diagnosis not present

## 2016-01-08 LAB — POCT GLYCOSYLATED HEMOGLOBIN (HGB A1C): Hemoglobin A1C: 6.4

## 2016-01-08 NOTE — Progress Notes (Signed)
Pre visit review using our clinic review tool, if applicable. No additional management support is needed unless otherwise documented below in the visit note. 

## 2016-01-08 NOTE — Progress Notes (Signed)
   Subjective:    Patient ID: John Frye, male    DOB: 1956/07/10, 60 y.o.   MRN: [id number]  HPI Here for follow-up type 2 diabetes. Patient had A1c 7.3% several months ago. We increased his metformin and he has done a tremendous job with weight loss. He states he's lost about 25 pounds altogether by reducing sugars and starches. Feels much better overall. Remains on metformin. No hypoglycemia. Patient elected to never start testosterone replacement.  He is also on thyroid replacement with levothyroxine and compliant with therapy. Plans to start more consistent exercise soon. No polyuria or polydipsia  Past Medical History  Diagnosis Date  . Dermatophytosis of nail 05/08/2008  . [place] DISEASE 05/08/2008  . HYPOTHYROIDISM 10/10/2009  . OBSTRUCTIVE SLEEP APNEA 05/08/2008  . ALLERGIC RHINITIS 05/12/2008  . SHOULDER PAIN, BILATERAL 10/10/2009  . BACK PAIN 05/08/2008  . PLANTAR FASCIITIS 05/08/2008  . INSOMNIA 06/17/2010  . PREDIABETES 09/18/2010   Past Surgical History  Procedure Laterality Date  . Tonsillectomy      reports that he has never smoked. He does not have any smokeless tobacco history on file. His alcohol and drug histories are not on file. family history includes Stroke (age of onset: 70) in his father. No Known Allergies    Review of Systems  Constitutional: Negative for fatigue and unexpected weight change.  Eyes: Negative for visual disturbance.  Respiratory: Negative for cough, chest tightness and shortness of breath.   Cardiovascular: Negative for chest pain, palpitations and leg swelling.  Endocrine: Negative for polydipsia and polyuria.  Neurological: Negative for dizziness, syncope, weakness, light-headedness and headaches.       Objective:   Physical Exam  Constitutional: He appears well-developed and well-nourished. No distress.  Cardiovascular: Normal rate and regular rhythm.   Pulmonary/Chest: Effort normal and breath sounds normal. No respiratory distress. He  has no wheezes. He has no rales.  Musculoskeletal: He exhibits no edema.          Assessment & Plan:  Type 2 diabetes.  Improved control with A1c 6.4%. Continue weight loss/weight maintenance efforts. Establish more consistent exercise. We'll plan routine follow-up 6 months. Repeat A1c along with lipids and TSH at that time

## 2016-01-14 ENCOUNTER — Telehealth: Payer: Self-pay | Admitting: Family Medicine

## 2016-01-14 MED ORDER — CETIRIZINE HCL 10 MG PO TABS: 10.0000 mg | ORAL_TABLET | Freq: Every day | ORAL | Status: DC

## 2016-01-14 MED ORDER — METFORMIN HCL 500 MG PO TABS: ORAL_TABLET | ORAL | Status: DC

## 2016-01-14 MED ORDER — LEVOTHYROXINE SODIUM 112 MCG PO TABS: ORAL_TABLET | ORAL | Status: DC

## 2016-01-14 NOTE — Telephone Encounter (Signed)
I have refilled Metformin, Zyrtec, and Levothyroxine to mail order.  Last OV 01-08-2016 Pending 07-17-2016  Last refill on Klonopin #90, 1rf 10/07/2011 Last refill on Flexeril #180, 1rf ---10/07/2011  Please advise.

## 2016-01-14 NOTE — Telephone Encounter (Signed)
Pt need refill on Rx metformin 500 mg, Zyrtec 10 mg, Klonopin 0.5 mg and Levothyroxine 112 mg.  Pt also stated need Rx for Flexeril for his back.  Pharm:  [facility]

## 2016-01-15 MED ORDER — CLONAZEPAM 0.5 MG PO TABS: 0.5000 mg | ORAL_TABLET | Freq: Every evening | ORAL | Status: DC | PRN

## 2016-01-15 MED ORDER — CYCLOBENZAPRINE HCL 10 MG PO TABS: 10.0000 mg | ORAL_TABLET | Freq: Three times a day (TID) | ORAL | Status: DC | PRN

## 2016-01-15 NOTE — Telephone Encounter (Signed)
May refill flexeril for #90 once. Avoid regular use of Klonopin.  May refill once with no additional refills

## 2016-01-15 NOTE — Telephone Encounter (Signed)
Printed RXs for signature. Then will Fax to mail order.

## 2016-01-19 ENCOUNTER — Other Ambulatory Visit: Payer: Self-pay | Admitting: Family Medicine

## 2016-01-21 NOTE — Telephone Encounter (Signed)
Last refill 01-15-2016 #90 Pending appt 07/17/2016 Last seen 01/08/2016 Please advise

## 2016-01-22 NOTE — Telephone Encounter (Signed)
This was just filled on April 12?  Should not need refill

## 2016-02-26 LAB — HM COLONOSCOPY

## 2016-03-03 ENCOUNTER — Other Ambulatory Visit: Payer: Self-pay | Admitting: Family Medicine

## 2016-03-11 ENCOUNTER — Telehealth: Payer: Self-pay | Admitting: Family Medicine

## 2016-03-11 NOTE — Telephone Encounter (Signed)
I would add low dose Amaryl 2 mg po once daily until he gets off the steroid.  Offer follow up in a couple of weeks- especially if sugars no better.

## 2016-03-11 NOTE — Telephone Encounter (Signed)
Please review

## 2016-03-11 NOTE — Telephone Encounter (Signed)
Pt states Dr Alice Reichert put him on a rectal hydrocortisone cream 100 mg/60 ml  and Liladal for his ulcerative colitis.  Pt states his sugars are running around 300 and he has 3 weeks left of the steroid. Would like to know what you think about him continuing. Asked pt if he had called Dr Alice Reichert, and pt states they were to send results to Dr Elease Hashimoto and pt thought dr Elease Hashimoto  would address, since Dr Alice Reichert told him nothing about any side effects for diabetics. Please advise.

## 2016-03-12 MED ORDER — GLIMEPIRIDE 2 MG PO TABS: ORAL_TABLET | ORAL | Status: DC

## 2016-03-12 NOTE — Telephone Encounter (Signed)
Pt is aware via voicemail of annotations below. Medication sent into local pharmacy with instructions as well.

## 2016-03-17 ENCOUNTER — Telehealth: Payer: Self-pay | Admitting: Family Medicine

## 2016-03-17 MED ORDER — GLIMEPIRIDE 2 MG PO TABS: ORAL_TABLET | ORAL | Status: DC

## 2016-03-17 NOTE — Telephone Encounter (Signed)
Medication sent in for patient. 

## 2016-03-17 NOTE — Telephone Encounter (Signed)
See previous closed phone note. Pt is currently using Hydrocortisone cream 125m/60ml daily. He was to add on Amaryl 239mwhile using cream.

## 2016-03-17 NOTE — Telephone Encounter (Signed)
He is aware of medication change.

## 2016-03-17 NOTE — Telephone Encounter (Signed)
I would go ahead and increase the Amaryl to 2 mg two tablets q AM.

## 2016-03-17 NOTE — Telephone Encounter (Signed)
Pt said he has taking the following med glimepiride (AMARYL) 2 MG tablet and it brought his sugar down to 200 and he is asking if he took 2 pills do Dr Elease Hashimoto think that will bring his sugar down to normal range.  Would like a call back    [phone]

## 2016-05-10 ENCOUNTER — Other Ambulatory Visit: Payer: Self-pay | Admitting: Family Medicine

## 2016-07-17 ENCOUNTER — Ambulatory Visit (INDEPENDENT_AMBULATORY_CARE_PROVIDER_SITE_OTHER): Payer: 59 | Admitting: Family Medicine

## 2016-07-17 ENCOUNTER — Encounter: Payer: Self-pay | Admitting: Family Medicine

## 2016-07-17 VITALS — BP 100/68 | HR 79 | Temp 98.4°F | Ht 74.0 in | Wt 249.0 lb

## 2016-07-17 DIAGNOSIS — K51919 Ulcerative colitis, unspecified with unspecified complications: Secondary | ICD-10-CM

## 2016-07-17 DIAGNOSIS — E119 Type 2 diabetes mellitus without complications: Secondary | ICD-10-CM | POA: Diagnosis not present

## 2016-07-17 DIAGNOSIS — K5190 Ulcerative colitis, unspecified, without complications: Secondary | ICD-10-CM | POA: Insufficient documentation

## 2016-07-17 DIAGNOSIS — M25561 Pain in right knee: Secondary | ICD-10-CM | POA: Diagnosis not present

## 2016-07-17 DIAGNOSIS — Z23 Encounter for immunization: Secondary | ICD-10-CM | POA: Diagnosis not present

## 2016-07-17 LAB — POCT GLYCOSYLATED HEMOGLOBIN (HGB A1C): Hemoglobin A1C: 7.1

## 2016-07-17 NOTE — Progress Notes (Signed)
Pre visit review using our clinic review tool, if applicable. No additional management support is needed unless otherwise documented below in the visit note. 

## 2016-07-17 NOTE — Progress Notes (Signed)
Subjective:     Patient ID: John Frye, male   DOB: 08-05-1956, 59 y.o.   MRN: [id number]  HPI Patient seen for the following issues  Type 2 diabetes.  Generally well-controlled but he had diagnosis of ulcer colitis last summer was on prolonged prednisone and only tapered off around early September. His blood sugars have finally improved after coming off prednisone. Last A1c was 6.4%. Remains on metformin. Briefly took some Amaryl but is now off that. Fasting blood sugars around 130. No polyuria or polydipsia  Bilateral knee pain right greater than left. He is concerned he may have osteoarthritis. He's not seen any effusion. No locking or giving way..  He avoids nonsteroidals because of recent diagnosis of ulcerative colitis. Has not had any x-rays of the. No reported injury. Denies any other general polyarthralgias.  He's taking Lialda for ulcerative colitis and that seems to be controlling his symptoms very well  Past Medical History:  Diagnosis Date  . ALLERGIC RHINITIS 05/12/2008  . BACK PAIN 05/08/2008  . Dermatophytosis of nail 05/08/2008  . [place] DISEASE 05/08/2008  . HYPOTHYROIDISM 10/10/2009  . INSOMNIA 06/17/2010  . OBSTRUCTIVE SLEEP APNEA 05/08/2008  . PLANTAR FASCIITIS 05/08/2008  . PREDIABETES 09/18/2010  . SHOULDER PAIN, BILATERAL 10/10/2009   Past Surgical History:  Procedure Laterality Date  . TONSILLECTOMY      reports that he has never smoked. He has never used smokeless tobacco. His alcohol and drug histories are not on file. family history includes Stroke (age of onset: 61) in his father. No Known Allergies   Review of Systems  Constitutional: Negative for fatigue.  Eyes: Negative for visual disturbance.  Respiratory: Negative for cough, chest tightness and shortness of breath.   Cardiovascular: Negative for chest pain, palpitations and leg swelling.  Endocrine: Negative for polydipsia and polyuria.  Musculoskeletal: Positive for arthralgias.  Neurological: Negative  for dizziness, syncope, weakness, light-headedness and headaches.       Objective:   Physical Exam  Constitutional: He appears well-developed and well-nourished.  Cardiovascular: Normal rate and regular rhythm.   Pulmonary/Chest: Effort normal and breath sounds normal. No respiratory distress. He has no wheezes. He has no rales.  Musculoskeletal:  Crepitus with flexion and extension of both knees. Full range of motion. No localized tenderness.       Assessment:     #1 type 2 diabetes. Repeat A1c today 7.1%. Probably has mild exacerbation related to recent prednisone  #2 ulcerative colitis with recent diagnosis currently controlled  #3 right knee pain. Suspect osteoarthritis    Plan:     -Flu vaccine given -Obtain x-rays right knee -Continue metformin and recheck A1c in 3 months. Hopefully will improve further now that he is off prednisone  Eulas Post MD [facility] at [facility]

## 2016-07-19 ENCOUNTER — Other Ambulatory Visit: Payer: Self-pay | Admitting: Family Medicine

## 2016-07-20 ENCOUNTER — Other Ambulatory Visit: Payer: Self-pay | Admitting: Family Medicine

## 2016-07-20 ENCOUNTER — Ambulatory Visit (INDEPENDENT_AMBULATORY_CARE_PROVIDER_SITE_OTHER)
Admission: RE | Admit: 2016-07-20 | Discharge: 2016-07-20 | Disposition: A | Discharge status: Home / Self Care | Payer: 59 | Source: Ambulatory Visit | Attending: Family Medicine | Admitting: Family Medicine

## 2016-07-20 ENCOUNTER — Other Ambulatory Visit: Payer: Self-pay

## 2016-07-20 DIAGNOSIS — M25561 Pain in right knee: Secondary | ICD-10-CM

## 2016-10-23 ENCOUNTER — Ambulatory Visit (INDEPENDENT_AMBULATORY_CARE_PROVIDER_SITE_OTHER): Payer: 59 | Admitting: Family Medicine

## 2016-10-23 ENCOUNTER — Encounter: Payer: Self-pay | Admitting: Family Medicine

## 2016-10-23 VITALS — BP 120/80 | HR 93 | Temp 98.1°F | Ht 74.0 in | Wt 257.0 lb

## 2016-10-23 DIAGNOSIS — E119 Type 2 diabetes mellitus without complications: Secondary | ICD-10-CM | POA: Diagnosis not present

## 2016-10-23 DIAGNOSIS — E038 Other specified hypothyroidism: Secondary | ICD-10-CM | POA: Diagnosis not present

## 2016-10-23 LAB — POCT GLYCOSYLATED HEMOGLOBIN (HGB A1C): Hemoglobin A1C: 7.1

## 2016-10-23 NOTE — Progress Notes (Signed)
Pre visit review using our clinic review tool, if applicable. No additional management support is needed unless otherwise documented below in the visit note. 

## 2016-10-23 NOTE — Progress Notes (Signed)
Subjective:     Patient ID: John Frye, male   DOB: Feb 07, 1956, 61 y.o.   MRN: [id number]  HPI Patient seen for medical follow-up. His chronic problems include history of obstructive sleep apnea, ulcerative colitis, hypothyroidism, type 2 diabetes. He is overdue for lab work. Last A1c 7.1%. Poor compliance with diet and exercise over the holidays. Last eye exam was approximately one year ago. He denies any polyuria or polydipsia. No neuropathy symptoms. Currently on metformin 500 mg 2 tablets twice daily.  Hypothyroidism treated with levothyroxine 112 g once daily.  Compliant with medications.  Works very long hours.  Past Medical History:  Diagnosis Date  . ALLERGIC RHINITIS 05/12/2008  . BACK PAIN 05/08/2008  . Dermatophytosis of nail 05/08/2008  . [place] DISEASE 05/08/2008  . HYPOTHYROIDISM 10/10/2009  . INSOMNIA 06/17/2010  . OBSTRUCTIVE SLEEP APNEA 05/08/2008  . PLANTAR FASCIITIS 05/08/2008  . PREDIABETES 09/18/2010  . SHOULDER PAIN, BILATERAL 10/10/2009   Past Surgical History:  Procedure Laterality Date  . TONSILLECTOMY      reports that he has never smoked. He has never used smokeless tobacco. His alcohol and drug histories are not on file. family history includes Stroke (age of onset: 32) in his father. No Known Allergies   Review of Systems  Constitutional: Negative for fatigue.  Eyes: Negative for visual disturbance.  Respiratory: Negative for cough, chest tightness and shortness of breath.   Cardiovascular: Negative for chest pain, palpitations and leg swelling.  Neurological: Negative for dizziness, syncope, weakness, light-headedness and headaches.       Objective:   Physical Exam  Constitutional: He is oriented to person, place, and time. He appears well-developed and well-nourished.  HENT:  Right Ear: External ear normal.  Left Ear: External ear normal.  Mouth/Throat: Oropharynx is clear and moist.  Eyes: Pupils are equal, round, and reactive to light.  Neck: Neck  supple. No thyromegaly present.  Cardiovascular: Normal rate and regular rhythm.   Pulmonary/Chest: Effort normal and breath sounds normal. No respiratory distress. He has no wheezes. He has no rales.  Musculoskeletal: He exhibits no edema.  Neurological: He is alert and oriented to person, place, and time.       Assessment:     #1 type 2 diabetes. Repeat A1c today 7.1%  #2 hypothyroidism-overdue for lab work    Plan:     -we discussed options of lifestyle management and attempts at weight loss and increased exercise versus additional medications for type 2 diabetes and he prefers the former. We'll recheck A1c in 3 months -Future labs ordered with TSH, lipid panel, hepatic panel, urine microalbumin -Set up follow-up eye exam -We discussed recommendation for statin in type 2 diabetes but he prefers to check lipids first.  Eulas Post MD [facility] at [facility]

## 2016-10-23 NOTE — Patient Instructions (Signed)
Set up eye exam for later this year. Try to lose some weight Get labs next week and we will call with results.

## 2016-11-01 ENCOUNTER — Other Ambulatory Visit: Payer: Self-pay | Admitting: Family Medicine

## 2016-11-02 ENCOUNTER — Other Ambulatory Visit: Payer: 59

## 2016-11-09 ENCOUNTER — Other Ambulatory Visit: Payer: 59

## 2016-11-16 ENCOUNTER — Encounter: Payer: Self-pay | Admitting: Family Medicine

## 2016-11-16 ENCOUNTER — Other Ambulatory Visit (INDEPENDENT_AMBULATORY_CARE_PROVIDER_SITE_OTHER): Payer: 59

## 2016-11-16 DIAGNOSIS — E119 Type 2 diabetes mellitus without complications: Secondary | ICD-10-CM

## 2016-11-16 DIAGNOSIS — E038 Other specified hypothyroidism: Secondary | ICD-10-CM

## 2016-11-16 LAB — LIPID PANEL
Cholesterol: 168 mg/dL (ref 0–200)
HDL: 54.4 mg/dL (ref >39.00)
LDL Cholesterol: 92 mg/dL (ref 0–99)
NonHDL: 113.26
Total CHOL/HDL Ratio: 3
Triglycerides: 108 mg/dL (ref 0.0–149.0)
VLDL: 21.6 mg/dL (ref 0.0–40.0)

## 2016-11-16 LAB — BASIC METABOLIC PANEL
BUN: 20 mg/dL (ref 6–23)
CO2: 29 mEq/L (ref 19–32)
Calcium: 9.3 mg/dL (ref 8.4–10.5)
Chloride: 101 mEq/L (ref 96–112)
Creatinine, Ser: 0.81 mg/dL (ref 0.40–1.50)
GFR: 103.79 mL/min (ref >60.00)
Glucose, Bld: 204 mg/dL — ABNORMAL HIGH (ref 70–99)
Potassium: 4.2 mEq/L (ref 3.5–5.1)
Sodium: 137 mEq/L (ref 135–145)

## 2016-11-16 LAB — MICROALBUMIN / CREATININE URINE RATIO
Creatinine,U: 136.4 mg/dL
Microalb Creat Ratio: 0.7 mg/g (ref 0.0–30.0)
Microalb, Ur: 1 mg/dL (ref 0.0–1.9)

## 2016-11-16 LAB — HEPATIC FUNCTION PANEL
ALT: 15 U/L (ref 0–53)
AST: 11 U/L (ref 0–37)
Albumin: 4.3 g/dL (ref 3.5–5.2)
Alkaline Phosphatase: 66 U/L (ref 39–117)
Bilirubin, Direct: 0.1 mg/dL (ref 0.0–0.3)
Total Bilirubin: 0.5 mg/dL (ref 0.2–1.2)
Total Protein: 6.5 g/dL (ref 6.0–8.3)

## 2016-11-16 LAB — TSH: TSH: 3.88 u[IU]/mL (ref 0.35–4.50)

## 2016-12-23 DIAGNOSIS — R5383 Other fatigue: Secondary | ICD-10-CM | POA: Diagnosis not present

## 2016-12-23 DIAGNOSIS — K5130 Ulcerative (chronic) rectosigmoiditis without complications: Secondary | ICD-10-CM | POA: Diagnosis not present

## 2017-01-01 DIAGNOSIS — G4733 Obstructive sleep apnea (adult) (pediatric): Secondary | ICD-10-CM | POA: Diagnosis not present

## 2017-01-22 ENCOUNTER — Ambulatory Visit (INDEPENDENT_AMBULATORY_CARE_PROVIDER_SITE_OTHER): Payer: 59 | Admitting: Family Medicine

## 2017-01-22 ENCOUNTER — Encounter: Payer: Self-pay | Admitting: Family Medicine

## 2017-01-22 DIAGNOSIS — E119 Type 2 diabetes mellitus without complications: Secondary | ICD-10-CM

## 2017-01-22 LAB — POCT GLYCOSYLATED HEMOGLOBIN (HGB A1C): Hemoglobin A1C: 7.3

## 2017-01-22 NOTE — Progress Notes (Signed)
Pre visit review using our clinic review tool, if applicable. No additional management support is needed unless otherwise documented below in the visit note. 

## 2017-01-22 NOTE — Progress Notes (Signed)
Subjective:     Patient ID: John Frye, male   DOB: 1956/06/17, 61 y.o.   MRN: [id number]  HPI Pt here for follow up Type 2 diabetes. Other chronic problems:  OSA, hx of Graves disease, ulcerative colitis, low testosterone, hypothyroidism.  Last A1C 7.1%.  Has not been compliant with diet or exercise- though weight unchanged. On Metformin.  No polyuria or polydipsia.  Usually 2 beers per night. Snacks usually with low CHO foods.  Recent lipids looked good. He declined statin use.  Past Medical History:  Diagnosis Date  . ALLERGIC RHINITIS 05/12/2008  . BACK PAIN 05/08/2008  . Dermatophytosis of nail 05/08/2008  . [place] DISEASE 05/08/2008  . HYPOTHYROIDISM 10/10/2009  . INSOMNIA 06/17/2010  . OBSTRUCTIVE SLEEP APNEA 05/08/2008  . PLANTAR FASCIITIS 05/08/2008  . PREDIABETES 09/18/2010  . SHOULDER PAIN, BILATERAL 10/10/2009   Past Surgical History:  Procedure Laterality Date  . TONSILLECTOMY      reports that he has never smoked. He has never used smokeless tobacco. His alcohol and drug histories are not on file. family history includes Stroke (age of onset: 32) in his father. No Known Allergies   Review of Systems  Constitutional: Negative for fatigue.  Eyes: Negative for visual disturbance.  Respiratory: Negative for cough, chest tightness and shortness of breath.   Cardiovascular: Negative for chest pain, palpitations and leg swelling.  Neurological: Negative for dizziness, syncope, weakness, light-headedness and headaches.       Objective:   Physical Exam  Constitutional: He is oriented to person, place, and time. He appears well-developed and well-nourished.  HENT:  Right Ear: External ear normal.  Left Ear: External ear normal.  Mouth/Throat: Oropharynx is clear and moist.  Eyes: Pupils are equal, round, and reactive to light.  Neck: Neck supple. No thyromegaly present.  Cardiovascular: Normal rate and regular rhythm.   Pulmonary/Chest: Effort normal and breath sounds  normal. No respiratory distress. He has no wheezes. He has no rales.  Musculoskeletal: He exhibits no edema.  Neurological: He is alert and oriented to person, place, and time.       Assessment:     Type 2 diabetes- A1C today 7.3%. Goal < 7.    Plan:     Discussed options:  Lifestyle management, DPP-4, GLP-1, or SGLT-2 medication.  Would probably avoid sulfonylurea.  He prefers to try lifestyle management and 3 month follow up.  If not < 7.0 at that time will add one of the above.  Eulas Post MD [facility] at [facility]

## 2017-03-12 DIAGNOSIS — D225 Melanocytic nevi of trunk: Secondary | ICD-10-CM | POA: Diagnosis not present

## 2017-03-12 DIAGNOSIS — D2271 Melanocytic nevi of right lower limb, including hip: Secondary | ICD-10-CM | POA: Diagnosis not present

## 2017-03-12 DIAGNOSIS — D2261 Melanocytic nevi of right upper limb, including shoulder: Secondary | ICD-10-CM | POA: Diagnosis not present

## 2017-03-12 DIAGNOSIS — D224 Melanocytic nevi of scalp and neck: Secondary | ICD-10-CM | POA: Diagnosis not present

## 2017-03-12 DIAGNOSIS — D2262 Melanocytic nevi of left upper limb, including shoulder: Secondary | ICD-10-CM | POA: Diagnosis not present

## 2017-03-12 DIAGNOSIS — D485 Neoplasm of uncertain behavior of skin: Secondary | ICD-10-CM | POA: Diagnosis not present

## 2017-03-24 ENCOUNTER — Other Ambulatory Visit: Payer: Self-pay | Admitting: Family Medicine

## 2017-04-23 ENCOUNTER — Ambulatory Visit: Payer: 59 | Admitting: Family Medicine

## 2017-05-14 DIAGNOSIS — L821 Other seborrheic keratosis: Secondary | ICD-10-CM | POA: Diagnosis not present

## 2017-06-24 ENCOUNTER — Encounter: Payer: Self-pay | Admitting: Family Medicine

## 2017-06-29 ENCOUNTER — Encounter: Payer: Self-pay | Admitting: Adult Health

## 2017-06-29 ENCOUNTER — Ambulatory Visit (INDEPENDENT_AMBULATORY_CARE_PROVIDER_SITE_OTHER): Payer: 59 | Admitting: Adult Health

## 2017-06-29 VITALS — BP 120/78 | HR 98 | Temp 98.4°F | Wt 255.2 lb

## 2017-06-29 DIAGNOSIS — J4521 Mild intermittent asthma with (acute) exacerbation: Secondary | ICD-10-CM | POA: Diagnosis not present

## 2017-06-29 MED ORDER — HYDROCODONE-HOMATROPINE 5-1.5 MG/5ML PO SYRP: 5.0000 mL | ORAL_SOLUTION | Freq: Three times a day (TID) | ORAL | 0 refills | Status: DC | PRN

## 2017-06-29 MED ORDER — PREDNISONE 10 MG PO TABS: 10.0000 mg | ORAL_TABLET | Freq: Every day | ORAL | 0 refills | Status: DC

## 2017-06-29 NOTE — Progress Notes (Signed)
Subjective:    Patient ID: John Frye, male    DOB: 1956-06-26, 61 y.o.   MRN: [id number]  HPI  61 year old male who  has a past medical history of ALLERGIC RHINITIS (05/12/2008); BACK PAIN (05/08/2008); Dermatophytosis of nail (05/08/2008); [place] DISEASE (05/08/2008); HYPOTHYROIDISM (10/10/2009); INSOMNIA (06/17/2010); OBSTRUCTIVE SLEEP APNEA (05/08/2008); PLANTAR FASCIITIS (05/08/2008); PREDIABETES (09/18/2010); and SHOULDER PAIN, BILATERAL (10/10/2009). He presents to the office today for one week of semi productive cough, shortness or breath with exertion, and wheezing. He reports a history of asthma?    He has been using Mucinex and Sudafed without relief.   He denies any fevers or feeling acutely ill.   Review of Systems See HPI   Past Medical History:  Diagnosis Date  . ALLERGIC RHINITIS 05/12/2008  . BACK PAIN 05/08/2008  . Dermatophytosis of nail 05/08/2008  . [place] DISEASE 05/08/2008  . HYPOTHYROIDISM 10/10/2009  . INSOMNIA 06/17/2010  . OBSTRUCTIVE SLEEP APNEA 05/08/2008  . PLANTAR FASCIITIS 05/08/2008  . PREDIABETES 09/18/2010  . SHOULDER PAIN, BILATERAL 10/10/2009    Social History   Social History  . Marital status: Married    Spouse name: N/A  . Number of children: N/A  . Years of education: N/A   Occupational History  . Not on file.   Social History Main Topics  . Smoking status: Never Smoker  . Smokeless tobacco: Never Used  . Alcohol use Not on file  . Drug use: Unknown  . Sexual activity: Not on file   Other Topics Concern  . Not on file   Social History Narrative   Raised in a foster family    Past Surgical History:  Procedure Laterality Date  . TONSILLECTOMY      Family History  Problem Relation Age of Onset  . Stroke Father 17    No Known Allergies  Current Outpatient Prescriptions on File Prior to Visit  Medication Sig Dispense Refill  . cetirizine (ZYRTEC) 10 MG tablet Take 1 tablet (10 mg total) by mouth daily. Reported on 01/08/2016 90 tablet 1  .  clonazePAM (KLONOPIN) 0.5 MG tablet Take 1 tablet (0.5 mg total) by mouth at bedtime as needed. 90 tablet 0  . cyclobenzaprine (FLEXERIL) 10 MG tablet Take 1 tablet (10 mg total) by mouth 3 (three) times daily as needed for muscle spasms. 90 tablet 0  . levothyroxine (SYNTHROID, LEVOTHROID) 112 MCG tablet TAKE 1 TABLET BY MOUTH  DAILY BEFORE BREAKFAST 90 tablet 2  . mesalamine (LIALDA) 1.2 g EC tablet Take by mouth. Takes 4 tablets per day.    . metFORMIN (GLUCOPHAGE) 500 MG tablet TAKE 2 TABLETS BY MOUTH TWO TIMES DAILY WITH A MEAL 360 tablet 2  . ONE TOUCH ULTRA TEST test strip USE 1 STRIP 2 TIMES DAILY  AS NEEDED 200 each 3  . ONETOUCH DELICA LANCETS FINE MISC 1 each by Does not apply route daily as needed. 100 each 6   No current facility-administered medications on file prior to visit.     BP 120/78 (BP Location: Right Arm, Patient Position: Sitting, Cuff Size: Large)   Pulse 98   Temp 98.4 F (36.9 C) (Oral)   Wt 255 lb 3.2 oz (115.8 kg)   SpO2 98%   BMI 32.77 kg/m       Objective:   Physical Exam  Constitutional: He is oriented to person, place, and time. He appears well-developed and well-nourished. No distress.  Neck: Normal range of motion. Neck supple.  Cardiovascular: Normal rate, regular  rhythm, normal heart sounds and intact distal pulses.  Exam reveals no gallop and no friction rub.   No murmur heard. Pulmonary/Chest: Effort normal. No respiratory distress. He has wheezes (throughout ). He has no rales. He exhibits no tenderness.  Lymphadenopathy:    He has no cervical adenopathy.  Neurological: He is alert and oriented to person, place, and time.  Skin: Skin is warm and dry. No rash noted. He is not diaphoretic. No erythema. No pallor.  Psychiatric: He has a normal mood and affect. His behavior is normal. Thought content normal.  Nursing note and vitals reviewed.     Assessment & Plan:  1. Mild intermittent asthma with acute exacerbation - No concern for  pneumonia. Advised to take prednisone with a large glass of water and to drink water throughout the day as it will cause his blood sugars to go up.  - HYDROcodone-homatropine (HYCODAN) 5-1.5 MG/5ML syrup; Take 5 mLs by mouth every 8 (eight) hours as needed for cough.  Dispense: 120 mL; Refill: 0 - predniSONE (DELTASONE) 10 MG tablet; Take 1 tablet (10 mg total) by mouth daily with breakfast.  Dispense: 5 tablet; Refill: 0 - Follow up if no improvement in 2-3 days or sooner if fever develops.   [facility]   Dorothyann Peng, NP

## 2017-07-10 ENCOUNTER — Other Ambulatory Visit: Payer: Self-pay | Admitting: Family Medicine

## 2017-08-03 ENCOUNTER — Other Ambulatory Visit: Payer: Self-pay | Admitting: Family Medicine

## 2017-08-23 DIAGNOSIS — E119 Type 2 diabetes mellitus without complications: Secondary | ICD-10-CM | POA: Diagnosis not present

## 2017-08-23 DIAGNOSIS — H2513 Age-related nuclear cataract, bilateral: Secondary | ICD-10-CM | POA: Diagnosis not present

## 2017-08-23 LAB — HM DIABETES EYE EXAM

## 2017-08-28 ENCOUNTER — Other Ambulatory Visit: Payer: Self-pay | Admitting: Family Medicine

## 2017-10-07 ENCOUNTER — Encounter: Payer: Self-pay | Admitting: Family Medicine

## 2017-10-15 DIAGNOSIS — K5130 Ulcerative (chronic) rectosigmoiditis without complications: Secondary | ICD-10-CM | POA: Diagnosis not present

## 2017-11-24 ENCOUNTER — Encounter: Payer: Self-pay | Admitting: Family Medicine

## 2017-11-24 ENCOUNTER — Ambulatory Visit: Payer: 59 | Admitting: Family Medicine

## 2017-11-24 VITALS — BP 108/68 | HR 71 | Temp 98.5°F | Wt 261.0 lb

## 2017-11-24 DIAGNOSIS — E119 Type 2 diabetes mellitus without complications: Secondary | ICD-10-CM | POA: Diagnosis not present

## 2017-11-24 DIAGNOSIS — M25551 Pain in right hip: Secondary | ICD-10-CM | POA: Diagnosis not present

## 2017-11-24 DIAGNOSIS — E039 Hypothyroidism, unspecified: Secondary | ICD-10-CM | POA: Diagnosis not present

## 2017-11-24 LAB — POCT GLYCOSYLATED HEMOGLOBIN (HGB A1C): Hemoglobin A1C: 7.8

## 2017-11-24 MED ORDER — METHYLPREDNISOLONE ACETATE 80 MG/ML IJ SUSP
80.0000 mg | Freq: Once | INTRAMUSCULAR | Status: AC
  Administered 2017-11-24: 80 mg via INTRAMUSCULAR

## 2017-11-24 MED ORDER — LEVOTHYROXINE SODIUM 112 MCG PO TABS: ORAL_TABLET | ORAL | 3 refills | Status: DC

## 2017-11-24 MED ORDER — CYCLOBENZAPRINE HCL 10 MG PO TABS: 10.0000 mg | ORAL_TABLET | Freq: Three times a day (TID) | ORAL | 0 refills | Status: DC | PRN

## 2017-11-24 NOTE — Progress Notes (Signed)
Subjective:     Patient ID: John Frye, male   DOB: [birth year], 62 y.o.   MRN: [id number]  HPI Patient seen for several things as follows  New problem of pain which is radiating from his right lower lumbar area around toward the hip and down to the groin region. He describes as a sharp pain. He's had sciatica symptoms in the past but this radiation is different. Previous pains radiated posterior lateral. This is more anterior. He has pain both at times with sitting and walking. Taken some Advil which helps. He's had some mild fleeting numbness in his right thigh which he has had for years. He had MRI scan lumbosacral spine 2008 which showed some degenerative changes especially L4-5 and L5-S1. Denies any urine or stool incontinence. No fever. No recent injury.  Type 2 diabetes. Is on metformin. Does not check blood sugars regularly. No polyuria or polydipsia.  Hypothyroidism on levothyroxine. Levels unstable for several years. Compliant with therapy.  Past Medical History:  Diagnosis Date  . ALLERGIC RHINITIS 05/12/2008  . BACK PAIN 05/08/2008  . Dermatophytosis of nail 05/08/2008  . [place] DISEASE 05/08/2008  . HYPOTHYROIDISM 10/10/2009  . INSOMNIA 06/17/2010  . OBSTRUCTIVE SLEEP APNEA 05/08/2008  . PLANTAR FASCIITIS 05/08/2008  . PREDIABETES 09/18/2010  . SHOULDER PAIN, BILATERAL 10/10/2009   Past Surgical History:  Procedure Laterality Date  . TONSILLECTOMY      reports that  has never smoked. he has never used smokeless tobacco. His alcohol and drug histories are not on file. family history includes Stroke (age of onset: 31) in his father. No Known Allergies   Review of Systems  Constitutional: Negative for appetite change, fatigue, fever and unexpected weight change.  Eyes: Negative for visual disturbance.  Respiratory: Negative for cough, chest tightness and shortness of breath.   Cardiovascular: Negative for chest pain, palpitations and leg swelling.  Gastrointestinal: Negative for  abdominal pain.  Endocrine: Negative for polydipsia and polyuria.  Musculoskeletal: Positive for back pain. Negative for joint swelling.  Neurological: Negative for dizziness, syncope, weakness, light-headedness and headaches.       Objective:   Physical Exam  Constitutional: He is oriented to person, place, and time. He appears well-developed and well-nourished. No distress.  Neck: No thyromegaly present.  Cardiovascular: Normal rate, regular rhythm and normal heart sounds.  No murmur heard. Pulmonary/Chest: Effort normal and breath sounds normal. No respiratory distress. He has no wheezes. He has no rales.  Musculoskeletal: He exhibits no edema.  Straight leg raises are negative bilaterally. Patient has some pain with external rotation right hip. Fair range of motion. No localized tenderness anterior or lateral hip  Neurological: He is alert and oriented to person, place, and time. He has normal reflexes. No cranial nerve deficit.  Full-strength lower extremities throughout. He has 2+ reflexes knee and ankle bilaterally.  Skin: No rash noted.       Assessment:     #1 right lower lumbar pain with radiation toward anterior hip. Question lumbosacral origin versus hip origin. May have some osteoarthritis right hip  #2 type 2 diabetes. A1c today 7.8%  #3 hypothyroidism    Plan:     -Recheck TSH -Refill levothyroxin and Flexeril -Recommend consider x-rays right hip to further assess -Depo-Medrol 80 mg IM given and follow blood sugars closely -We recommend he try to some weight. We discussed options for diabetes management. We discussed further medications such as SGLT 2 or GLP-1 class versus lifestyle management and reassess in 3 months and  he prefers the latter.  Eulas Post MD [facility] at [facility]

## 2017-11-25 LAB — TSH: TSH: 4.08 u[IU]/mL (ref 0.35–4.50)

## 2017-11-26 ENCOUNTER — Ambulatory Visit (INDEPENDENT_AMBULATORY_CARE_PROVIDER_SITE_OTHER): Payer: 59

## 2017-11-26 DIAGNOSIS — M1611 Unilateral primary osteoarthritis, right hip: Secondary | ICD-10-CM | POA: Diagnosis not present

## 2017-11-26 DIAGNOSIS — M25551 Pain in right hip: Secondary | ICD-10-CM | POA: Diagnosis not present

## 2017-11-27 ENCOUNTER — Other Ambulatory Visit: Payer: Self-pay | Admitting: Family Medicine

## 2017-12-06 ENCOUNTER — Encounter: Payer: Self-pay | Admitting: *Deleted

## 2018-01-22 ENCOUNTER — Other Ambulatory Visit: Payer: Self-pay | Admitting: Family Medicine

## 2018-04-01 ENCOUNTER — Encounter: Payer: Self-pay | Admitting: Family Medicine

## 2018-04-01 ENCOUNTER — Ambulatory Visit: Payer: 59 | Admitting: Family Medicine

## 2018-04-01 VITALS — BP 110/74 | HR 73 | Temp 98.3°F | Wt 255.5 lb

## 2018-04-01 DIAGNOSIS — M545 Low back pain, unspecified: Secondary | ICD-10-CM

## 2018-04-01 DIAGNOSIS — E119 Type 2 diabetes mellitus without complications: Secondary | ICD-10-CM

## 2018-04-01 DIAGNOSIS — M1611 Unilateral primary osteoarthritis, right hip: Secondary | ICD-10-CM | POA: Diagnosis not present

## 2018-04-01 LAB — POCT GLYCOSYLATED HEMOGLOBIN (HGB A1C): Hemoglobin A1C: 7.3 % — AB (ref 4.0–5.6)

## 2018-04-01 NOTE — Patient Instructions (Signed)
Let's set up physical therapy for the back pain.    Let me know if PT not helping after a few weeks.

## 2018-04-01 NOTE — Progress Notes (Signed)
  Subjective:     Patient ID: John Frye, male   DOB: Sep 13, 1956, 62 y.o.   MRN: [id number]  HPI Patient seen for the following items  Type 2 diabetes. A1c back in February 7.8%. We elected not to add additional medications but patient prefered trial of weight loss and improving dietary compliance. He has lost a few pounds but states he still has poor diet compliance at times. Not monitoring sugars regularly. No polyuria or polydipsia. Remains on metformin 500 milligrams 2 tablets twice daily  Ongoing pain right lower lumbar area. Really starting to limit activities. Some radiation toward the right groin. Recent x-ray right hip revealed degenerative changes right hip greater than left. He does have some stiffness in the right hip and pain but feels his back is more limiting. He had MRI scan 11 years ago which showed degenerative changes mostly L4-L5 and L5-S1. Has never had any physical therapy.  Denies lower extremity numbness or weakness  Past Medical History:  Diagnosis Date  . ALLERGIC RHINITIS 05/12/2008  . BACK PAIN 05/08/2008  . Dermatophytosis of nail 05/08/2008  . [place] DISEASE 05/08/2008  . HYPOTHYROIDISM 10/10/2009  . INSOMNIA 06/17/2010  . OBSTRUCTIVE SLEEP APNEA 05/08/2008  . PLANTAR FASCIITIS 05/08/2008  . PREDIABETES 09/18/2010  . SHOULDER PAIN, BILATERAL 10/10/2009   Past Surgical History:  Procedure Laterality Date  . TONSILLECTOMY      reports that he has never smoked. He has never used smokeless tobacco. His alcohol and drug histories are not on file. family history includes Stroke (age of onset: 58) in his father. No Known Allergies   Review of Systems  Constitutional: Negative for appetite change, chills, fever and unexpected weight change.  Endocrine: Negative for polydipsia and polyuria.  Genitourinary: Negative for dysuria.  Musculoskeletal: Positive for back pain.  Neurological: Negative for weakness and numbness.       Objective:   Physical Exam   Constitutional: He appears well-developed and well-nourished.  Cardiovascular: Normal rate and regular rhythm.  Pulmonary/Chest: Effort normal and breath sounds normal. He has no wheezes. He has no rales.  Musculoskeletal: He exhibits no edema.  Mild pain with right hip internal rotation  Straight leg raise are negative bilaterally  Neurological:  Full-strength lower extremities. 2+ reflexes knee and ankle bilaterally       Assessment:     #1 type 2 diabetes somewhat improved with A1c 7.3%  #2 degenerative arthritis right hip greater than left  #3 chronic low back pain mostly right lumbar region    Plan:     -Discuss options regarding diabetes. He is reluctant to add more medication. Recommend continue trial of additional weight loss and reassess in 4 months. If A1c not below 7 at that point consider additional medication  -Set up trial of physical therapy for back pain.  If not improving with PT consider referral to back specialist.  -Discussed hip. He does not feel that his pain is severe enough to warrant appointment yet with orthopedist  Eulas Post MD [facility] at [facility]

## 2018-04-14 ENCOUNTER — Ambulatory Visit: Payer: 59 | Attending: Family Medicine | Admitting: Physical Therapy

## 2018-04-14 ENCOUNTER — Other Ambulatory Visit: Payer: Self-pay

## 2018-04-14 ENCOUNTER — Encounter: Payer: Self-pay | Admitting: Physical Therapy

## 2018-04-14 DIAGNOSIS — M545 Low back pain: Secondary | ICD-10-CM | POA: Insufficient documentation

## 2018-04-14 DIAGNOSIS — M25551 Pain in right hip: Secondary | ICD-10-CM | POA: Insufficient documentation

## 2018-04-14 DIAGNOSIS — M6281 Muscle weakness (generalized): Secondary | ICD-10-CM | POA: Insufficient documentation

## 2018-04-14 DIAGNOSIS — R2689 Other abnormalities of gait and mobility: Secondary | ICD-10-CM

## 2018-04-14 NOTE — Therapy (Signed)
[facility] [address]  [place], [place], [address] Phone: [phone]   Fax:  [phone]  Physical Therapy Evaluation  Patient Details  Name: John Frye MRN: [id number] Date of Birth: 10/27/1955 Referring Provider: Carolann Littler, MD   Encounter Date: 04/14/2018  PT End of Session - 04/14/18 1821    Visit Number  1    Number of Visits  12    Date for PT Re-Evaluation  06/09/18    PT Start Time  0504    PT Stop Time  0548    PT Time Calculation (min)  44 min    Activity Tolerance  Patient tolerated treatment well    Behavior During Therapy  [facility] for tasks assessed/performed       Past Medical History:  Diagnosis Date  . ALLERGIC RHINITIS 05/12/2008  . BACK PAIN 05/08/2008  . Dermatophytosis of nail 05/08/2008  . [place] DISEASE 05/08/2008  . HYPOTHYROIDISM 10/10/2009  . INSOMNIA 06/17/2010  . OBSTRUCTIVE SLEEP APNEA 05/08/2008  . PLANTAR FASCIITIS 05/08/2008  . PREDIABETES 09/18/2010  . SHOULDER PAIN, BILATERAL 10/10/2009    Past Surgical History:  Procedure Laterality Date  . TONSILLECTOMY      There were no vitals filed for this visit.   Subjective Assessment - 04/14/18 1713    Subjective  Pt expressing that he has had R back pain as long as he can remember, but not at this level until the beginning of February. Sitting is very bothersome to the back and he has noticed that when he isn't at work and is not sitting so much that his back pain seems to get better.  Has noticed that with certain movements he gets a very sharp pain in the front of the hip region that can travel down into the front of the thigh. Pt has experienced a numb feeling in the front of the R thigh for about 10 years.     Limitations  Sitting;Walking    How long can you sit comfortably?  60 minutes    Patient Stated Goals  Decrease pain, be able to workout     Currently in Pain?  Yes    Pain Score  3     Pain Location  Back    Pain  Orientation  Right    Pain Descriptors / Indicators  Discomfort;Sharp;Aching    Pain Type  Acute pain    Pain Radiating Towards  Front of R thigh/groin area    Pain Onset  More than a month ago    Aggravating Factors   Sitting    Pain Relieving Factors  Back brace, standing, working    Effect of Pain on Daily Activities  Walking, jogging, working out    Multiple Pain Sites  No         [facility] PT Assessment - 04/14/18 0001      Assessment   Medical Diagnosis  Lumbar back pain    Referring Provider  Carolann Littler, MD    Onset Date/Surgical Date  11/14/17    Next MD Visit  PRN     Prior Therapy  No       Balance Screen   Has the patient fallen in the past 6 months  No    Has the patient had a decrease in activity level because of a fear of falling?   No    Is the patient reluctant to leave their home because of a fear of falling?  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     Leisure  [place] work, housework, work on cars, fishing      Observation/Other Assessments   Focus on Therapeutic Outcomes (FOTO)   Intake - status 47% (limited 53%); predicted - status 61% (39% limitation)      Sensation   Additional Comments  Pt mentioned numb sensation in L2 dermatomal pattern      Functional Tests   Functional tests  Squat      Squat   Comments  Pt rises up onto heels early with excessive hip flexion during motion      Posture/Postural Control   Postural Limitations  Posterior pelvic tilt;Decreased lumbar lordosis    Posture Comments  Pt walks, sits, stands with PPT       AROM   Right/Left Hip  Right;Left    Lumbar Flexion  WNL    Lumbar Extension  25% limited    Lumbar - Right Side Bend  WNL    Lumbar - Left Side Bend  WNL    Lumbar - Right Rotation  WNL    Lumbar - Left Rotation  WNL      Strength   Right/Left Hip  Right;Left    Right Hip Flexion  4-/5    Right Hip Extension  4-/5    Right Hip External Rotation   4+/5    Right Hip Internal Rotation  4+/5    Right Hip ABduction  5/5    Right Hip ADduction  4-/5    Left Hip Flexion  4/5    Left Hip Extension  4/5 + for pain in R anterior hip    Left Hip External Rotation  5/5    Left Hip Internal Rotation  5/5    Left Hip ABduction  5/5    Left Hip ADduction  4+/5    Right/Left Knee  Right;Left    Right Knee Flexion  5/5    Right Knee Extension  5/5    Left Knee Flexion  5/5    Left Knee Extension  5/5      Flexibility   Hamstrings  Mild tightness bilaterally      Palpation   Palpation comment  No TTP noted, increased fascial restriction in R quad musculature      Special Tests    Special Tests  Lumbar;Hip Special Tests    Lumbar Tests  Slump Test;Straight Leg Raise    Hip Special Tests   Hip Scouring      Slump test   Findings  Negative    Side  Right      Straight Leg Raise   Findings  Negative    Side   Right      Hip Scouring   Findings  Positive    Side  Right      Ambulation/Gait   Ambulation/Gait Assistance  7: Independent    Ambulation Distance (Feet)  80 Feet    Gait Pattern  Decreased hip/knee flexion - left;Decreased hip/knee flexion - right;Decreased trunk rotation;Trunk flexed    Ambulation Surface  Level;Indoor                Objective measurements completed on examination: See above findings.      [place] Adult PT Treatment/Exercise - 04/14/18 0001      Lumbar Exercises: Stretches   Hip Flexor Stretch  30 seconds;1 rep;Right    Hip Flexor Stretch Limitations  Supine with R knee off of side of table; Pt expressed 1 catching pain getting  into position    Pelvic Tilt  1 rep    Pelvic Tilt Limitations  Into less of PPT in sitting      Lumbar Exercises: Supine   Bridge  10 reps;3 seconds             PT Education - 04/14/18 1809    Education Details  Pt educated on results of examination, HEP exercises and POC    Person(s) Educated  Patient    Methods  Explanation;Handout;Verbal cues    Comprehension  Verbalized understanding;Returned demonstration       PT Short Term Goals - 04/14/18 1828      PT SHORT TERM GOAL #1   Title  Pt will be independent with initial HEP    Status  New    Target Date  05/05/18      PT SHORT TERM GOAL #2   Title  Pt will demonstrate and describe proper body sitting/standing body mechanics    Status  New    Target Date  05/05/18        PT Long Term Goals - 04/14/18 1829      PT LONG TERM GOAL #1   Title  Pt will be independent with advanced HEP    Status  New    Target Date  05/26/18      PT LONG TERM GOAL #2   Title  Pt will have 5/5 global hip musculature to improve stability and biomechanics of hip and low back    Status  New    Target Date  05/26/18      PT LONG TERM GOAL #3   Title  Pt will report no increase in symptoms throughout a normal work day     Status  New    Target Date  05/26/18      PT LONG TERM GOAL #4   Title  Pt will report pain/discomfort in the front of the R hip no greater than 1/10     Status  New    Target Date  05/26/18             Plan - 04/14/18 1822    Clinical Impression Statement  Pt is a 62 y/o M who presents to OP PT with c/c of LBP. Pt also expresses some pain/discomfort in the front of the R hip area into the groin. Examination revealed decreased hip musculature strength, posture and gait abnormalities, + Scour test and pain with R hip movement. These impairments are limiting pt's ability to walk and perform other recreational activities such as working out, and limit his ability to sit at work. Pt's prognosis is negatively  impacted by the nature of his job and the fact that he has to do a lot of sitting, and the chronicity of the issue.     Clinical Presentation  Stable    Clinical Decision Making  Low  Rehab Potential  Good    PT Frequency  2x / week    PT Duration  6 weeks    PT Treatment/Interventions  ADLs/Self Care Home Management;Electrical Stimulation;Iontophoresis 51m/ml Dexamethasone;Moist Heat;Traction;Gait training;Functional mobility training;Therapeutic activities;Therapeutic exercise;Neuromuscular re-education;Patient/family education;Manual techniques;Dry needling;Taping    Consulted and Agree with Plan of Care  Patient       Patient will benefit from skilled therapeutic intervention in order to improve the following deficits and impairments:  Abnormal gait, Decreased strength, Pain, Difficulty walking, Improper body mechanics, Postural dysfunction  Visit Diagnosis: Acute right-sided low back pain, with sciatica presence unspecified  Pain in right hip  Muscle weakness (generalized)  Other abnormalities of gait and mobility     Problem List Patient Active Problem List   Diagnosis Date Noted  . Primary osteoarthritis of right hip 04/01/2018  . Ulcerative colitis ([place] 07/17/2016  . Low testosterone 06/20/2015  . Type 2 diabetes mellitus, controlled ([place] 03/03/2013  . INSOMNIA 06/17/2010  . Hypothyroidism 10/10/2009  . SHOULDER PAIN, BILATERAL 10/10/2009  . ALLERGIC RHINITIS 05/12/2008  . DERMATOPHYTOSIS OF NAIL 05/08/2008  . [place]EASE 05/08/2008  . Obstructive sleep apnea 05/08/2008  . BACK PAIN 05/08/2008  . PLANTAR FASCIITIS 05/08/2008    JShirline Frees SPT 04/14/2018, 6:43 PM  [facility] [place] [address]Phone: [phone]  Fax:  [phone] Name: John YonoMRN: [id number] of Birth: [birth year]

## 2018-04-18 ENCOUNTER — Ambulatory Visit: Payer: 59 | Admitting: Physical Therapy

## 2018-04-18 ENCOUNTER — Encounter: Payer: Self-pay | Admitting: Physical Therapy

## 2018-04-18 DIAGNOSIS — M545 Low back pain: Secondary | ICD-10-CM

## 2018-04-18 DIAGNOSIS — R2689 Other abnormalities of gait and mobility: Secondary | ICD-10-CM

## 2018-04-18 DIAGNOSIS — M6281 Muscle weakness (generalized): Secondary | ICD-10-CM

## 2018-04-18 DIAGNOSIS — M25551 Pain in right hip: Secondary | ICD-10-CM

## 2018-04-18 NOTE — Therapy (Signed)
[facility] [address]  [place], [place], [address] Phone: [phone]   Fax:  [phone]  Physical Therapy Treatment  Patient Details  Name: John Frye MRN: [id number] Date of Birth: 1955-10-15 Referring Provider: Carolann Littler, MD   Encounter Date: 04/18/2018  PT End of Session - 04/18/18 1615    Visit Number  2    Number of Visits  12    Date for PT Re-Evaluation  06/09/18    Authorization Type  [employer]    PT Start Time  5790    PT Stop Time  1659    PT Time Calculation (min)  44 min    Activity Tolerance  Patient tolerated treatment well    Behavior During Therapy  [facility] for tasks assessed/performed       Past Medical History:  Diagnosis Date  . ALLERGIC RHINITIS 05/12/2008  . BACK PAIN 05/08/2008  . Dermatophytosis of nail 05/08/2008  . [place] DISEASE 05/08/2008  . HYPOTHYROIDISM 10/10/2009  . INSOMNIA 06/17/2010  . OBSTRUCTIVE SLEEP APNEA 05/08/2008  . PLANTAR FASCIITIS 05/08/2008  . PREDIABETES 09/18/2010  . SHOULDER PAIN, BILATERAL 10/10/2009    Past Surgical History:  Procedure Laterality Date  . TONSILLECTOMY      There were no vitals filed for this visit.  Subjective Assessment - 04/18/18 1615    Subjective  Pt states pain/discomfort is always there, but varies in intensity and will occasionally grab/catch when he moves the wrong way.    Patient Stated Goals  Decrease pain, be able to workout     Currently in Pain?  Yes    Pain Score  2     Pain Location  Back & R groin    Pain Orientation  Lower;Right    Pain Descriptors / Indicators  Discomfort    Pain Type  Acute pain    Pain Frequency  Constant fluctuating intensity                       [facility] Adult PT Treatment/Exercise - 04/18/18 1615      Exercises   Exercises  Lumbar      Lumbar Exercises: Stretches   Passive Hamstring Stretch  Right;1 rep;30 seconds    Passive Hamstring Stretch Limitations  supine with strap -  good ROM    Lower Trunk Rotation  2 reps;30 seconds    Prone on Elbows Stretch  3 reps 15 sec    Quad Stretch  Right;30 seconds;2 reps    Quad Stretch Limitations  prone RF stretch with strap & knee on bolster      Lumbar Exercises: Aerobic   Recumbent Bike  L2 x 6 min      Lumbar Exercises: Supine   Ab Set  10 reps;5 seconds    Bridge with Ball Squeeze  10 reps;5 seconds    Isometric Hip Flexion  10 reps;5 seconds    Isometric Hip Flexion Limitations  alt unilateral      Manual Therapy   Manual Therapy  Soft tissue mobilization;Myofascial release    Manual therapy comments  slight hooklying    Soft tissue mobilization  R proximal quad & distal hip flexors    Myofascial Release  manual TPR R quads - VL & RF             PT Education - 04/18/18 1659    Education Details  HEP update    Person(s) Educated  Patient  Methods  Explanation;Demonstration    Comprehension  Verbalized understanding;Returned demonstration       PT Short Term Goals - 04/18/18 1644      PT SHORT TERM GOAL #1   Title  Pt will be independent with initial HEP    Status  On-going      PT SHORT TERM GOAL #2   Title  Pt will demonstrate and describe proper body sitting/standing body mechanics    Status  On-going        PT Long Term Goals - 04/18/18 1645      PT LONG TERM GOAL #1   Title  Pt will be independent with advanced HEP    Status  On-going      PT LONG TERM GOAL #2   Title  Pt will have 5/5 global hip musculature to improve stability and biomechanics of hip and low back    Status  On-going      PT LONG TERM GOAL #3   Title  Pt will report no increase in symptoms throughout John normal work day     Status  On-going      PT LONG TERM GOAL #4   Title  Pt will report pain/discomfort in the front of the R hip no greater than 1/10     Status  On-going            Plan - 04/18/18 1644    Clinical Impression Statement  John Frye reporting he has had to do HEP hip flexor stretch  on the counter top at home or John table at work due to edge of bed too soft and low - provided instruction in alternative version of stretch in prone with strap and hip extended on bolster with good tolerance. Progressed mobility and strengthening exercises to promote increased flexibility and improved muscle balance with HEP updated accordingly as pt will be unabel to return until next week.    Rehab Potential  Good    PT Treatment/Interventions  ADLs/Self Care Home Management;Electrical Stimulation;Iontophoresis 47m/ml Dexamethasone;Moist Heat;Traction;Gait training;Functional mobility training;Therapeutic activities;Therapeutic exercise;Neuromuscular re-education;Patient/family education;Manual techniques;Dry needling;Taping    Consulted and Agree with Plan of Care  Patient       Patient will benefit from skilled therapeutic intervention in order to improve the following deficits and impairments:  Abnormal gait, Decreased strength, Pain, Difficulty walking, Improper body mechanics, Postural dysfunction  Visit Diagnosis: Acute right-sided low back pain, with sciatica presence unspecified  Pain in right hip  Muscle weakness (generalized)  Other abnormalities of gait and mobility     Problem List Patient Active Problem List   Diagnosis Date Noted  . Primary osteoarthritis of right hip 04/01/2018  . Ulcerative colitis ([place] 07/17/2016  . Low testosterone 06/20/2015  . Type 2 diabetes mellitus, controlled ([place] 03/03/2013  . INSOMNIA 06/17/2010  . Hypothyroidism 10/10/2009  . SHOULDER PAIN, BILATERAL 10/10/2009  . ALLERGIC RHINITIS 05/12/2008  . DERMATOPHYTOSIS OF NAIL 05/08/2008  . [place]EASE 05/08/2008  . Obstructive sleep apnea 05/08/2008  . BACK PAIN 05/08/2008  . PLANTAR FASCIITIS 05/08/2008    JPercival Spanish PT, MPT 04/18/2018, 6:34 PM  [facility] [place] [address]Phone:  [phone]  Fax:  [phone] Name: AGlenmore KarlMRN: [id number] of Birth: 612/27/1957

## 2018-04-25 ENCOUNTER — Ambulatory Visit: Payer: 59

## 2018-04-25 DIAGNOSIS — M6281 Muscle weakness (generalized): Secondary | ICD-10-CM

## 2018-04-25 DIAGNOSIS — R2689 Other abnormalities of gait and mobility: Secondary | ICD-10-CM

## 2018-04-25 DIAGNOSIS — M545 Low back pain: Secondary | ICD-10-CM

## 2018-04-25 DIAGNOSIS — M25551 Pain in right hip: Secondary | ICD-10-CM

## 2018-04-25 NOTE — Therapy (Signed)
[facility] [address]  [place], [place], [address] Phone: [phone]   Fax:  [phone]  Physical Therapy Treatment  Patient Details  Name: John Frye MRN: [id number] Date of Birth: 02-Oct-1956 Referring Provider: Carolann Littler, MD   Encounter Date: 04/25/2018  PT End of Session - 04/25/18 1712    Visit Number  3    Number of Visits  12    Date for PT Re-Evaluation  06/09/18    Authorization Type  UHC    PT Start Time  5170    PT Stop Time  1744    PT Time Calculation (min)  39 min    Activity Tolerance  Patient tolerated treatment well    Behavior During Therapy  [facility] for tasks assessed/performed       Past Medical History:  Diagnosis Date  . ALLERGIC RHINITIS 05/12/2008  . BACK PAIN 05/08/2008  . Dermatophytosis of nail 05/08/2008  . [place] DISEASE 05/08/2008  . HYPOTHYROIDISM 10/10/2009  . INSOMNIA 06/17/2010  . OBSTRUCTIVE SLEEP APNEA 05/08/2008  . PLANTAR FASCIITIS 05/08/2008  . PREDIABETES 09/18/2010  . SHOULDER PAIN, BILATERAL 10/10/2009    Past Surgical History:  Procedure Laterality Date  . TONSILLECTOMY      There were no vitals filed for this visit.  Subjective Assessment - 04/25/18 1708    Subjective  Pt. doing well.  Noting some difficulty/increased LBP with POE HEP stretch.      Patient Stated Goals  Decrease pain, be able to workout     Currently in Pain?  Yes    Pain Score  1     Pain Location  Back    Pain Orientation  Lower;Right    Pain Descriptors / Indicators  Discomfort    Pain Type  Acute pain    Pain Onset  More than a month ago    Pain Frequency  Constant    Aggravating Factors   Sitting    Pain Relieving Factors  Standing, working     Multiple Pain Sites  No                       [facility] Adult PT Treatment/Exercise - 04/25/18 1720      Lumbar Exercises: Stretches   Hip Flexor Stretch  30 seconds;1 rep;Right    Hip Flexor Stretch Limitations  mod thomas  with strap assist       Lumbar Exercises: Aerobic   Nustep  lvl 4, 7 min       Lumbar Exercises: Machines for Strengthening   Other Lumbar Machine Exercise  Lat pulldown 20# x 15 reps     Other Lumbar Machine Exercise  BATCA low row 20# x 15 reps       Lumbar Exercises: Standing   Other Standing Lumbar Exercises  B BATCA cable pallof press 5# x 15 reps       Lumbar Exercises: Seated   Long Arc Quad on [place]  Right;Left;Strengthening;10 reps    Hip Flexion on Ball  Right;Left;10 reps;Strengthening    Hip Flexion on Ball Limitations  seated on red p-ball squeeze     Other Seated Lumbar Exercises  B shoulder extension with green TB while seated on red p-ball x 15 reps  Some cueing for scapular retraction       Lumbar Exercises: Supine   Clam  10 reps;3 seconds    Clam Limitations  Hooklying alternating hip abd/ER with red TB at  knees + abdom. bracing     Isometric Hip Flexion  5 seconds;15 reps    Isometric Hip Flexion Limitations  with heels on peanut p-ball                PT Short Term Goals - 04/18/18 1644      PT SHORT TERM GOAL #1   Title  Pt will be independent with initial HEP    Status  On-going      PT SHORT TERM GOAL #2   Title  Pt will demonstrate and describe proper body sitting/standing body mechanics    Status  On-going        PT Long Term Goals - 04/18/18 1645      PT LONG TERM GOAL #1   Title  Pt will be independent with advanced HEP    Status  On-going      PT LONG TERM GOAL #2   Title  Pt will have 5/5 global hip musculature to improve stability and biomechanics of hip and low back    Status  On-going      PT LONG TERM GOAL #3   Title  Pt will report no increase in symptoms throughout a normal work day     Status  On-going      PT LONG TERM GOAL #4   Title  Pt will report pain/discomfort in the front of the R hip no greater than 1/10     Status  On-going            Plan - 04/25/18 1713    Clinical Impression Statement  Pt. doing  well today reporting decrease in overall LBP since starting therapy.  Tolerated addition of seated lumbopelvic stability activities on p-ball, scapular strengthening, and machine strengthening for performance at work gym well.  Ended session pain free.   Will monitor response to today's session and progress accordingly in coming visits.    PT Treatment/Interventions  ADLs/Self Care Home Management;Electrical Stimulation;Iontophoresis 72m/ml Dexamethasone;Moist Heat;Traction;Gait training;Functional mobility training;Therapeutic activities;Therapeutic exercise;Neuromuscular re-education;Patient/family education;Manual techniques;Dry needling;Taping    Consulted and Agree with Plan of Care  Patient       Patient will benefit from skilled therapeutic intervention in order to improve the following deficits and impairments:  Abnormal gait, Decreased strength, Pain, Difficulty walking, Improper body mechanics, Postural dysfunction  Visit Diagnosis: Acute right-sided low back pain, with sciatica presence unspecified  Pain in right hip  Muscle weakness (generalized)  Other abnormalities of gait and mobility     Problem List Patient Active Problem List   Diagnosis Date Noted  . Primary osteoarthritis of right hip 04/01/2018  . Ulcerative colitis ([place] 07/17/2016  . Low testosterone 06/20/2015  . Type 2 diabetes mellitus, controlled (HDrew 03/03/2013  . INSOMNIA 06/17/2010  . Hypothyroidism 10/10/2009  . SHOULDER PAIN, BILATERAL 10/10/2009  . ALLERGIC RHINITIS 05/12/2008  . DERMATOPHYTOSIS OF NAIL 05/08/2008  . [place]rous[place]EASE 05/08/2008  . Obstructive sleep apnea 05/08/2008  . BACK PAIN 05/08/2008  . PLANTAR FASCIITIS 05/08/2008    MBess Harvest PTA 04/25/18 5:50 PM   [facility] [address] [place] [address]Phone: [phone]  Fax:  [phone] Name: John SothMRN: [id number] of Birth:  6May 26, 1957

## 2018-04-28 ENCOUNTER — Ambulatory Visit: Payer: 59

## 2018-04-28 DIAGNOSIS — R2689 Other abnormalities of gait and mobility: Secondary | ICD-10-CM

## 2018-04-28 DIAGNOSIS — M545 Low back pain: Secondary | ICD-10-CM | POA: Diagnosis not present

## 2018-04-28 DIAGNOSIS — M6281 Muscle weakness (generalized): Secondary | ICD-10-CM

## 2018-04-28 DIAGNOSIS — M25551 Pain in right hip: Secondary | ICD-10-CM

## 2018-04-28 NOTE — Therapy (Signed)
[facility] [address]  [place], [place], [address] Phone: [phone]   Fax:  [phone]  Physical Therapy Treatment  Patient Details  Name: John Frye MRN: [id number] Date of Birth: [birth year] Referring Provider: Carolann Littler, MD   Encounter Date: 04/28/2018  PT End of Session - 04/28/18 1706    Visit Number  4    Number of Visits  12    Date for PT Re-Evaluation  06/09/18    Authorization Type  UHC    PT Start Time  1701    PT Stop Time  1741    PT Time Calculation (min)  40 min    Activity Tolerance  Patient tolerated treatment well    Behavior During Therapy  [facility] for tasks assessed/performed       Past Medical History:  Diagnosis Date  . ALLERGIC RHINITIS 05/12/2008  . BACK PAIN 05/08/2008  . Dermatophytosis of nail 05/08/2008  . [place] DISEASE 05/08/2008  . HYPOTHYROIDISM 10/10/2009  . INSOMNIA 06/17/2010  . OBSTRUCTIVE SLEEP APNEA 05/08/2008  . PLANTAR FASCIITIS 05/08/2008  . PREDIABETES 09/18/2010  . SHOULDER PAIN, BILATERAL 10/10/2009    Past Surgical History:  Procedure Laterality Date  . TONSILLECTOMY      There were no vitals filed for this visit.  Subjective Assessment - 04/28/18 1703    Subjective  Had muscular soreness in LE following last visit.      Patient Stated Goals  Decrease pain, be able to workout     Currently in Pain?  Yes    Pain Score  1     Pain Location  Back    Pain Orientation  Lower;Right    Pain Descriptors / Indicators  Aching "Stiff"    Pain Type  Acute pain    Pain Radiating Towards  front of R thigh/groin     Pain Onset  More than a month ago    Pain Frequency  Constant    Aggravating Factors   climbing stairs     Multiple Pain Sites  No                       [facility] Adult PT Treatment/Exercise - 04/28/18 1713      Lumbar Exercises: Stretches   Hip Flexor Stretch  Right;30 seconds    Hip Flexor Stretch Limitations  kneeling on foam on floor  and chair     Other Lumbar Stretch Exercise  R groin stretch 2 x 30 sec therapist manual guidance and pt. alone       Lumbar Exercises: Aerobic   Nustep  lvl 5, 7 min       Lumbar Exercises: Machines for Strengthening   Other Lumbar Machine Exercise  Lat pulldown 25# x 15 reps     Other Lumbar Machine Exercise  B single arm BATCA low row 20# x 10 each arm       Lumbar Exercises: Supine   Dead Bug  10 reps;3 seconds    Dead Bug Limitations  cues for proper motion; opposite LE resting on table     Bridge with [employer]  15 reps;5 seconds    Straight Leg Raise  10 reps;3 seconds    Straight Leg Raises Limitations  B     Isometric Hip Flexion  5 seconds;15 reps    Isometric Hip Flexion Limitations  with heels on peanut p-ball       Lumbar Exercises: Sidelying  Hip Abduction  10 reps;Right;Left;3 seconds    Other Sidelying Lumbar Exercises  R adduction SLR x 10 reps       Lumbar Exercises: Quadruped   Madcat/Old Horse  10 reps    Madcat/Old Horse Limitations  3" hold into extension     Straight Leg Raise  10 reps;3 seconds    Straight Leg Raises Limitations  cues for neutral spine                PT Short Term Goals - 04/18/18 1644      PT SHORT TERM GOAL #1   Title  Pt will be independent with initial HEP    Status  On-going      PT SHORT TERM GOAL #2   Title  Pt will demonstrate and describe proper body sitting/standing body mechanics    Status  On-going        PT Long Term Goals - 04/18/18 1645      PT LONG TERM GOAL #1   Title  Pt will be independent with advanced HEP    Status  On-going      PT LONG TERM GOAL #2   Title  Pt will have 5/5 global hip musculature to improve stability and biomechanics of hip and low back    Status  On-going      PT LONG TERM GOAL #3   Title  Pt will report no increase in symptoms throughout a normal work day     Status  On-going      PT LONG TERM GOAL #4   Title  Pt will report pain/discomfort in the front of the R  hip no greater than 1/10     Status  On-going            Plan - 04/28/18 1706    Clinical Impression Statement  Pt. doing well today.  Felt mild muscular soreness following last visit, which subsided next day.  Tolerated addition of quadruped extension kickback, hooklying Dead Bug, and progression of machine strengthening activities well today without back pain.  Did complain of intermittent R hip flexor/groin pain, which responded well to LE stretching with relief.  Ended session pain free thus modalities deferred.      PT Treatment/Interventions  ADLs/Self Care Home Management;Electrical Stimulation;Iontophoresis 3m/ml Dexamethasone;Moist Heat;Traction;Gait training;Functional mobility training;Therapeutic activities;Therapeutic exercise;Neuromuscular re-education;Patient/family education;Manual techniques;Dry needling;Taping    Consulted and Agree with Plan of Care  Patient       Patient will benefit from skilled therapeutic intervention in order to improve the following deficits and impairments:  Abnormal gait, Decreased strength, Pain, Difficulty walking, Improper body mechanics, Postural dysfunction  Visit Diagnosis: Acute right-sided low back pain, with sciatica presence unspecified  Pain in right hip  Muscle weakness (generalized)  Other abnormalities of gait and mobility     Problem List Patient Active Problem List   Diagnosis Date Noted  . Primary osteoarthritis of right hip 04/01/2018  . Ulcerative colitis ([place] 07/17/2016  . Low testosterone 06/20/2015  . Type 2 diabetes mellitus, controlled (HPort LaBelle 03/03/2013  . INSOMNIA 06/17/2010  . Hypothyroidism 10/10/2009  . SHOULDER PAIN, BILATERAL 10/10/2009  . ALLERGIC RHINITIS 05/12/2008  . DERMATOPHYTOSIS OF NAIL 05/08/2008  . GTwin LakesDISEASE 05/08/2008  . Obstructive sleep apnea 05/08/2008  . BACK PAIN 05/08/2008  . PLANTAR FASCIITIS 05/08/2008    MBess Harvest PTA 04/28/18 5:47 PM   [facility] [address] [place] [address]  Phone: [phone]   Fax:  [phone]  Name: John Frye MRN: [id number] Date of Birth: 1956-01-22

## 2018-05-02 ENCOUNTER — Encounter: Payer: Self-pay | Admitting: Physical Therapy

## 2018-05-02 ENCOUNTER — Ambulatory Visit: Payer: 59 | Admitting: Physical Therapy

## 2018-05-02 DIAGNOSIS — M6281 Muscle weakness (generalized): Secondary | ICD-10-CM

## 2018-05-02 DIAGNOSIS — M25551 Pain in right hip: Secondary | ICD-10-CM

## 2018-05-02 DIAGNOSIS — M545 Low back pain: Secondary | ICD-10-CM | POA: Diagnosis not present

## 2018-05-02 DIAGNOSIS — R2689 Other abnormalities of gait and mobility: Secondary | ICD-10-CM

## 2018-05-02 NOTE — Therapy (Addendum)
[facility] [address]  [place], [place], [address] Phone: [phone]   Fax:  [phone]  Physical Therapy Treatment  Patient Details  Name: John Frye MRN: [id number] Date of Birth: Mar 06, 1956 Referring Provider: Carolann Littler, MD   Encounter Date: 05/02/2018  PT End of Session - 05/02/18 1749    Visit Number  5    Number of Visits  12    Date for PT Re-Evaluation  06/09/18    Authorization Type  UHC    PT Start Time  1701    PT Stop Time  1746    PT Time Calculation (min)  45 min    Activity Tolerance  Patient tolerated treatment well    Behavior During Therapy  [facility] for tasks assessed/performed       Past Medical History:  Diagnosis Date  . ALLERGIC RHINITIS 05/12/2008  . BACK PAIN 05/08/2008  . Dermatophytosis of nail 05/08/2008  . [place] DISEASE 05/08/2008  . HYPOTHYROIDISM 10/10/2009  . INSOMNIA 06/17/2010  . OBSTRUCTIVE SLEEP APNEA 05/08/2008  . PLANTAR FASCIITIS 05/08/2008  . PREDIABETES 09/18/2010  . SHOULDER PAIN, BILATERAL 10/10/2009    Past Surgical History:  Procedure Laterality Date  . TONSILLECTOMY      There were no vitals filed for this visit.  Subjective Assessment - 05/02/18 1704    Subjective  Pt noting that he continues to experience some catching in the back, especially when turning in different directions. 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     Limitations  Sitting;Walking    How long can you sit comfortably?  60 min    Patient Stated Goals  Decrease pain, be able to workout     Currently in Pain?  Yes    Pain Score  2  7 at worst    Pain Location  Back    Pain Orientation  Right;Lower    Pain Descriptors / Indicators  Aching;Sharp;Dull    Pain Radiating Towards  Front of R thigh/groin                       [facility] Adult PT Treatment/Exercise - 05/02/18 0001      Lumbar Exercises: Standing   Forward Lunge   10 reps    Forward Lunge Limitations  Each LE leading; Cues for good form with adequate spacing between feet      Lumbar Exercises: Seated   Other Seated Lumbar Exercises  From sitting on Green PB walking out until ball rested between shoulders; holding for a few seconds, then using LEs to walk back up onto ball. 10 reps      Lumbar Exercises: Supine   Bridge  10 reps    Bridge Limitations  With upper back/shoulders supported on PBall      Lumbar Exercises: Prone   Other Prone Lumbar Exercises  Prone over Green PB with arms extended; walk-outs to patient comfort. 10 reps    Other Prone Lumbar Exercises  Press-ups onto elbows; 10 reps; Prone with arms extended; 10 reps       Lumbar Exercises: Quadruped   Opposite Arm/Leg Raise  10 reps;Right arm/Left leg;Left arm/Right leg    Plank  3 x 30 seconds      Knee/Hip Exercises: Standing   Forward Step Up  10 reps;Both;Hand Hold: 0;Step Height: 8"    Forward Step Up Limitations  With opposite leg knee drive  Functional Squat  10 reps    Functional Squat Limitations  With TRX cables for UE support/balance    Other Standing Knee Exercises  Russian twists in standing with unweighted ball; 10 reps to each direction               PT Short Term Goals - 04/18/18 1644      PT SHORT TERM GOAL #1   Title  Pt will be independent with initial HEP    Status  On-going      PT SHORT TERM GOAL #2   Title  Pt will demonstrate and describe proper body sitting/standing body mechanics    Status  On-going        PT Long Term Goals - 04/18/18 1645      PT LONG TERM GOAL #1   Title  Pt will be independent with advanced HEP    Status  On-going      PT LONG TERM GOAL #2   Title  Pt will have 5/5 global hip musculature to improve stability and biomechanics of hip and low back    Status  On-going      PT LONG TERM GOAL #3   Title  Pt will report no increase in symptoms throughout a normal work day     Status  On-going      PT LONG TERM  GOAL #4   Title  Pt will report pain/discomfort in the front of the R hip no greater than 1/10     Status  On-going            Plan - 05/02/18 1911    Clinical Impression Statement  Session today focused on strengthening and stabilization exercises for core musculature in order to increase musculature support to the low back as well as decrease pain with movement. LE strengthening also incorporated to improve functional mobility. Pt had good tolerance for all activities with one minor increase in pain with opposite arm/leg exercises in quadraped. Pt will continue to benefit from physical therapy to address pain, improve functional mobility, and progress toward pt goals.     PT Treatment/Interventions  ADLs/Self Care Home Management;Electrical Stimulation;Iontophoresis 93m/ml Dexamethasone;Moist Heat;Traction;Gait training;Functional mobility training;Therapeutic activities;Therapeutic exercise;Neuromuscular re-education;Patient/family education;Manual techniques;Dry needling;Taping    Consulted and Agree with Plan of Care  Patient       Patient will benefit from skilled therapeutic intervention in order to improve the following deficits and impairments:  Abnormal gait, Decreased strength, Pain, Difficulty walking, Improper body mechanics, Postural dysfunction  Visit Diagnosis: Acute right-sided low back pain, with sciatica presence unspecified  Pain in right hip  Muscle weakness (generalized)  Other abnormalities of gait and mobility     Problem List Patient Active Problem List   Diagnosis Date Noted  . Primary osteoarthritis of right hip 04/01/2018  . Ulcerative colitis (HFreeborn 07/17/2016  . Low testosterone 06/20/2015  . Type 2 diabetes mellitus, controlled (HDrumright 03/03/2013  . INSOMNIA 06/17/2010  . Hypothyroidism 10/10/2009  . SHOULDER PAIN, BILATERAL 10/10/2009  . ALLERGIC RHINITIS 05/12/2008  . DERMATOPHYTOSIS OF NAIL 05/08/2008  . GJurupa ValleyDISEASE 05/08/2008  .  Obstructive sleep apnea 05/08/2008  . BACK PAIN 05/08/2008  . PLANTAR FASCIITIS 05/08/2008    JShirline Frees SPT 05/02/2018, 7:45 PM  [facility] [place] [address]Phone: [phone]  Fax:  [phone] Name: ABari HandshoeMRN: [id number] of Birth: 618-Aug-1957

## 2018-05-05 ENCOUNTER — Encounter: Payer: Self-pay | Admitting: Physical Therapy

## 2018-05-05 ENCOUNTER — Ambulatory Visit: Payer: 59 | Attending: Family Medicine | Admitting: Physical Therapy

## 2018-05-05 DIAGNOSIS — R2689 Other abnormalities of gait and mobility: Secondary | ICD-10-CM | POA: Diagnosis present

## 2018-05-05 DIAGNOSIS — M6281 Muscle weakness (generalized): Secondary | ICD-10-CM | POA: Insufficient documentation

## 2018-05-05 DIAGNOSIS — M545 Low back pain: Secondary | ICD-10-CM | POA: Diagnosis not present

## 2018-05-05 DIAGNOSIS — M25551 Pain in right hip: Secondary | ICD-10-CM

## 2018-05-05 NOTE — Therapy (Addendum)
[facility] [address]  [place], [place], [address] Phone: [phone]   Fax:  [phone]  Physical Therapy Treatment  Patient Details  Name: John Frye MRN: [id number] Date of Birth: 04/09/1956 Referring Provider: Carolann Littler, MD   Encounter Date: 05/05/2018  PT End of Session - 05/05/18 1659    Visit Number  6    Number of Visits  12    Date for PT Re-Evaluation  06/09/18    Authorization Type  UHC    PT Start Time  1657    PT Stop Time  1743    PT Time Calculation (min)  46 min    Activity Tolerance  Patient tolerated treatment well    Behavior During Therapy  [facility] for tasks assessed/performed       Past Medical History:  Diagnosis Date  . ALLERGIC RHINITIS 05/12/2008  . BACK PAIN 05/08/2008  . Dermatophytosis of nail 05/08/2008  . [employer] DISEASE 05/08/2008  . HYPOTHYROIDISM 10/10/2009  . INSOMNIA 06/17/2010  . OBSTRUCTIVE SLEEP APNEA 05/08/2008  . PLANTAR FASCIITIS 05/08/2008  . PREDIABETES 09/18/2010  . SHOULDER PAIN, BILATERAL 10/10/2009    Past Surgical History:  Procedure Laterality Date  . TONSILLECTOMY      There were no vitals filed for this visit.  Subjective Assessment - 05/05/18 1703    Subjective  Pt noting very mild discomfort today. States he was lunging earlier and experienced some twinging in the hip.     Limitations  Sitting;Walking    Patient Stated Goals  Decrease pain, be able to workout     Currently in Pain?  Yes    Pain Score  1     Pain Location  Hip    Pain Orientation  Right    Pain Descriptors / Indicators  Aching;Dull;Discomfort                       [facility] Adult PT Treatment/Exercise - 05/05/18 0001      Lumbar Exercises: Aerobic   Recumbent Bike  L4 x 6 min      Lumbar Exercises: Standing   Forward Lunge  --    Forward Lunge Limitations  --    Push / Pull Sled  4 x 150 ft. 2 reps pulling/2 reps pushing with 30# in cart. PT provided VC to prevent  forward trunk lean and engagement of glute and back extensor muscles.       Lumbar Exercises: Prone   Other Prone Lumbar Exercises  Prone over Red TB; 10 roll outs with arms extended; 5 second hold with good control throughout movement      Knee/Hip Exercises: Standing   Forward Lunges  15 reps;Both;Limitations    Forward Lunges Limitations  With UE support from TRX cables. Pt required cueing from PT for proper form and mirror was placed beside pt to view form during exercise.     Forward Step Up  15 reps;Hand Hold: 1;Right    Forward Step Up Limitations  With opposite leg knee drive and focus on eccentric lowering; 14" step with AirEx pad    Wall Squat  10 reps    Wall Squat Limitations  Against wall with Orange PB behind the back. Pt limited in depth secondary to knee pain     Lunge Walking - Round Trips  --    Other Standing Knee Exercises  Monster walks forward/backward x20; Blue TB around knees    Other Standing  Knee Exercises  Lateral walking x 20 ft each direction; blue TB around ankles               PT Short Term Goals - 05/05/18 1700      PT SHORT TERM GOAL #1   Title  Pt will be independent with initial HEP    Status  Achieved      PT SHORT TERM GOAL #2   Title  Pt will demonstrate and describe proper body sitting/standing body mechanics    Status  Achieved        PT Long Term Goals - 04/18/18 1645      PT LONG TERM GOAL #1   Title  Pt will be independent with advanced HEP    Status  On-going      PT LONG TERM GOAL #2   Title  Pt will have 5/5 global hip musculature to improve stability and biomechanics of hip and low back    Status  On-going      PT LONG TERM GOAL #3   Title  Pt will report no increase in symptoms throughout a normal work day     Status  On-going      PT LONG TERM GOAL #4   Title  Pt will report pain/discomfort in the front of the R hip no greater than 1/10     Status  On-going            Plan - 05/05/18 1758    Clinical  Impression Statement  Pt tolerated treatment session well today with emphasis on hip strengthening during functional activities. Pt still noting some "twinges" of pain with movement, especially when changing from eccentric to concentric contractions with R hip flexor muscles. Posture while at work discussed again with patient today and demonstrated proper sitting posture with education about ergonomics of computer set up. He will continue to benefit from physical therapy to address his pain levels and address his posture during work activities, as well as progress toward functional goals.     Rehab Potential  Good    PT Treatment/Interventions  ADLs/Self Care Home Management;Electrical Stimulation;Iontophoresis 50m/ml Dexamethasone;Moist Heat;Traction;Gait training;Functional mobility training;Therapeutic activities;Therapeutic exercise;Neuromuscular re-education;Patient/family education;Manual techniques;Dry needling;Taping    Consulted and Agree with Plan of Care  Patient       Patient will benefit from skilled therapeutic intervention in order to improve the following deficits and impairments:  Abnormal gait, Decreased strength, Pain, Difficulty walking, Improper body mechanics, Postural dysfunction  Visit Diagnosis: Acute right-sided low back pain, with sciatica presence unspecified  Pain in right hip  Muscle weakness (generalized)  Other abnormalities of gait and mobility     Problem List Patient Active Problem List   Diagnosis Date Noted  . Primary osteoarthritis of right hip 04/01/2018  . Ulcerative colitis (HGalva 07/17/2016  . Low testosterone 06/20/2015  . Type 2 diabetes mellitus, controlled (HHar[place] 03/03/2013  . INSOMNIA 06/17/2010  . Hypothyroidism 10/10/2009  . SHOULDER PAIN, BILATERAL 10/10/2009  . ALLERGIC RHINITIS 05/12/2008  . DERMATOPHYTOSIS OF NAIL 05/08/2008  . GWilburton Number OneDISEASE 05/08/2008  . Obstructive sleep apnea 05/08/2008  . BACK PAIN 05/08/2008  . PLANTAR  FASCIITIS 05/08/2008    John Frye SPT 05/05/2018, 6:47 PM  [facility] [place] [address]Phone: [phone]  Fax:  [phone] Name: John SerioMRN: [id number] of Birth: [birth year]

## 2018-05-09 ENCOUNTER — Ambulatory Visit: Payer: 59

## 2018-05-09 DIAGNOSIS — R2689 Other abnormalities of gait and mobility: Secondary | ICD-10-CM

## 2018-05-09 DIAGNOSIS — M545 Low back pain: Secondary | ICD-10-CM

## 2018-05-09 DIAGNOSIS — M25551 Pain in right hip: Secondary | ICD-10-CM

## 2018-05-09 DIAGNOSIS — M6281 Muscle weakness (generalized): Secondary | ICD-10-CM

## 2018-05-09 NOTE — Therapy (Signed)
[facility] [place] [address]  [place], [place], [address] Phone: [phone]   Fax:  [phone]  Physical Therapy Treatment  Patient Details  Name: John Frye MRN: [id number] Date of Birth: Dec 26, 1955 Referring Provider: Carolann Littler, MD   Encounter Date: 05/09/2018  PT End of Session - 05/09/18 1709    Visit Number  7    Number of Visits  12    Date for PT Re-Evaluation  06/09/18    Authorization Type  UHC    PT Start Time  1702    PT Stop Time  1740    PT Time Calculation (min)  38 min    Activity Tolerance  Patient tolerated treatment well    Behavior During Therapy  [facility] for tasks assessed/performed       Past Medical History:  Diagnosis Date  . ALLERGIC RHINITIS 05/12/2008  . BACK PAIN 05/08/2008  . Dermatophytosis of nail 05/08/2008  . [place] DISEASE 05/08/2008  . HYPOTHYROIDISM 10/10/2009  . INSOMNIA 06/17/2010  . OBSTRUCTIVE SLEEP APNEA 05/08/2008  . PLANTAR FASCIITIS 05/08/2008  . PREDIABETES 09/18/2010  . SHOULDER PAIN, BILATERAL 10/10/2009    Past Surgical History:  Procedure Laterality Date  . TONSILLECTOMY      There were no vitals filed for this visit.  Subjective Assessment - 05/09/18 1707    Subjective  Pt. noting he worked on car and has had sore back since yesterday.      Patient Stated Goals  Decrease pain, be able to workout     Currently in Pain?  Yes    Pain Score  1     Pain Location  Back    Pain Orientation  Right    Pain Descriptors / Indicators  Aching    Pain Type  Acute pain    Pain Radiating Towards  front or R thigh/groin     Pain Onset  More than a month ago    Pain Frequency  Constant    Aggravating Factors   Working on car     Multiple Pain Sites  No                       [facility] Adult PT Treatment/Exercise - 05/09/18 1714      Lumbar Exercises: Aerobic   Recumbent Bike  L4 x 7 min      Lumbar Exercises: Machines for Strengthening   Other Lumbar  Machine Exercise  Lat pulldown 35# x 15 reps     Other Lumbar Machine Exercise  B BATCA low row 35# x 15 each arm       Lumbar Exercises: Standing   Forward Lunge  10 reps    Forward Lunge Limitations  TRX assistance    Shoulder Extension  Both;10 reps;Strengthening;Theraband    Theraband Level (Shoulder Extension)  Level 3 (Green)      Lumbar Exercises: Seated   Other Seated Lumbar Exercises  B seated pallof press with red TB seated on red p-ball with one LE on floor x 10 reps each way  required cueing for proper technique       Lumbar Exercises: Supine   Dead Bug  10 reps;3 seconds    Dead Bug Limitations  LE elevated off table throughout       Lumbar Exercises: Quadruped   Plank  2 x 40 seconds               PT Short Term Goals -  05/05/18 1700      PT SHORT TERM GOAL #1   Title  Pt will be independent with initial HEP    Status  Achieved      PT SHORT TERM GOAL #2   Title  Pt will demonstrate and describe proper body sitting/standing body mechanics    Status  Achieved        PT Long Term Goals - 04/18/18 1645      PT LONG TERM GOAL #1   Title  Pt will be independent with advanced HEP    Status  On-going      PT LONG TERM GOAL #2   Title  Pt will have 5/5 global hip musculature to improve stability and biomechanics of hip and low back    Status  On-going      PT LONG TERM GOAL #3   Title  Pt will report no increase in symptoms throughout a normal work day     Status  On-going      PT LONG TERM GOAL #4   Title  Pt will report pain/discomfort in the front of the R hip no greater than 1/10     Status  On-going            Plan - 05/09/18 1710    Clinical Impression Statement  Pt. doing well today.  Did have some back soreness following working on car yesterday, which has lingered into today.  Tolerated progression of seated p-ball stability activities well today without back pain.  Progressed dead bug and prone planking as to improve lumbopelvic  support to reduce lumbar strain with functional tasks.  Progressing well toward goals.      PT Treatment/Interventions  ADLs/Self Care Home Management;Electrical Stimulation;Iontophoresis 36m/ml Dexamethasone;Moist Heat;Traction;Gait training;Functional mobility training;Therapeutic activities;Therapeutic exercise;Neuromuscular re-education;Patient/family education;Manual techniques;Dry needling;Taping    Consulted and Agree with Plan of Care  Patient       Patient will benefit from skilled therapeutic intervention in order to improve the following deficits and impairments:  Abnormal gait, Decreased strength, Pain, Difficulty walking, Improper body mechanics, Postural dysfunction  Visit Diagnosis: Acute right-sided low back pain, with sciatica presence unspecified  Pain in right hip  Muscle weakness (generalized)  Other abnormalities of gait and mobility     Problem List Patient Active Problem List   Diagnosis Date Noted  . Primary osteoarthritis of right hip 04/01/2018  . Ulcerative colitis ([place] 07/17/2016  . Low testosterone 06/20/2015  . Type 2 diabetes mellitus, controlled ([place] 03/03/2013  . INSOMNIA 06/17/2010  . Hypothyroidism 10/10/2009  . SHOULDER PAIN, BILATERAL 10/10/2009  . ALLERGIC RHINITIS 05/12/2008  . DERMATOPHYTOSIS OF NAIL 05/08/2008  . [place] 05/08/2008  . Obstructive sleep apnea 05/08/2008  . BACK PAIN 05/08/2008  . PLANTAR FASCIITIS 05/08/2008    MBess Harvest PTA 05/09/18 5:50 PM   [facility] [address] [place] [address]Phone: [phone]  Fax:  [phone] Name: John FoutzMRN: [id number] of Birth: [birth year]

## 2018-05-12 ENCOUNTER — Encounter: Payer: Self-pay | Admitting: Physical Therapy

## 2018-05-12 ENCOUNTER — Ambulatory Visit: Payer: 59 | Admitting: Physical Therapy

## 2018-05-12 DIAGNOSIS — M545 Low back pain: Secondary | ICD-10-CM | POA: Diagnosis not present

## 2018-05-12 DIAGNOSIS — R2689 Other abnormalities of gait and mobility: Secondary | ICD-10-CM

## 2018-05-12 DIAGNOSIS — M6281 Muscle weakness (generalized): Secondary | ICD-10-CM

## 2018-05-12 DIAGNOSIS — M25551 Pain in right hip: Secondary | ICD-10-CM

## 2018-05-12 NOTE — Patient Instructions (Signed)

## 2018-05-12 NOTE — Therapy (Addendum)
[facility] [address]  [place], [place], [address] Phone: [phone]   Fax:  [phone]  Physical Therapy Treatment  Patient Details  Name: John Frye MRN: [id number] Date of Birth: [birth year] Referring Provider: Carolann Littler, MD   Encounter Date: 05/12/2018  PT End of Session - 05/12/18 1658    Visit Number  8    Number of Visits  12    Date for PT Re-Evaluation  06/09/18    Authorization Type  UHC    PT Start Time  5003    PT Stop Time  1751    PT Time Calculation (min)  56 min    Activity Tolerance  Patient tolerated treatment well    Behavior During Therapy  [facility] for tasks assessed/performed       Past Medical History:  Diagnosis Date  . ALLERGIC RHINITIS 05/12/2008  . BACK PAIN 05/08/2008  . Dermatophytosis of nail 05/08/2008  . [place] DISEASE 05/08/2008  . HYPOTHYROIDISM 10/10/2009  . INSOMNIA 06/17/2010  . OBSTRUCTIVE SLEEP APNEA 05/08/2008  . PLANTAR FASCIITIS 05/08/2008  . PREDIABETES 09/18/2010  . SHOULDER PAIN, BILATERAL 10/10/2009    Past Surgical History:  Procedure Laterality Date  . TONSILLECTOMY      There were no vitals filed for this visit.  Subjective Assessment - 05/12/18 1702    Subjective  Pt noting increased pain levels today that he attributes to spending more time working on a car and increased aggrivation and frustration at work.     Patient Stated Goals  Decrease pain, be able to workout     Currently in Pain?  Yes    Pain Score  5     Pain Location  Back    Pain Orientation  Right    Pain Descriptors / Indicators  Discomfort    Pain Type  Acute pain    Pain Radiating Towards  front of R thigh/groin    Multiple Pain Sites  No                       [facility] Adult PT Treatment/Exercise - 05/12/18 0001      Exercises   Exercises  Knee/Hip;Lumbar      Lumbar Exercises: Stretches   Double Knee to Chest Stretch  2 reps;30 seconds    Double Knee to Chest  Stretch Limitations  Sitting on mat with Orange PB in front; lumbar flexion to the point of stretch.     Lower Trunk Rotation  1 rep;30 seconds    Lower Trunk Rotation Limitations  With knees rotationg to the L     Piriformis Stretch  Right;1 rep;30 seconds    Piriformis Stretch Limitations  Pt unable to adduct leg to L shoulder; stretch to tolerance      Knee/Hip Exercises: Standing   Forward Lunges  Right;10 reps    Forward Lunges Limitations  With UE support at TRX cables; R LE in front with better form demonstrated during session today     Functional Squat  10 reps    Functional Squat Limitations  with ER at hips; 2 UE support at counter    Other Standing Knee Exercises  Single leg RDLs on R LE with 5# weight in each hand.        Modalities   Modalities  Moist Heat      Moist Heat Therapy   Number Minutes Moist Heat  15 Minutes    Moist Heat  Location  Hip      Manual Therapy   Manual Therapy  Myofascial release    Manual therapy comments  Prone    Soft tissue mobilization  R glute musculature and lower lumbar erector spinae, and R piriformis muscle    Myofascial Release  Manual TPR with good response in piriformis muscle. With TPR of glute max musculature pt expressed reproduction of symptoms into the groin area. Pt noted relief in pain with manual and good response noted in glute max               PT Short Term Goals - 05/05/18 1700      PT SHORT TERM GOAL #1   Title  Pt will be independent with initial HEP    Status  Achieved      PT SHORT TERM GOAL #2   Title  Pt will demonstrate and describe proper body sitting/standing body mechanics    Status  Achieved        PT Long Term Goals - 04/18/18 1645      PT LONG TERM GOAL #1   Title  Pt will be independent with advanced HEP    Status  On-going      PT LONG TERM GOAL #2   Title  Pt will have 5/5 global hip musculature to improve stability and biomechanics of hip and low back    Status  On-going      PT  LONG TERM GOAL #3   Title  Pt will report no increase in symptoms throughout a normal work day     Status  On-going      PT LONG TERM GOAL #4   Title  Pt will report pain/discomfort in the front of the R hip no greater than 1/10     Status  On-going            Plan - 05/12/18 1829    Clinical Impression Statement  Pt expressed an increase in symptoms and discomfort with sitting upon arriving to therapy session today. Therefore, session focused on manual therapy and soft tissue extensibility to address any recent changes in muscle tension secondary to sustained postures and work-related stress as expressed by the patient.  Pt tolerated session well with reproduction of radiating symptoms into the groin region with manual therapy to glute max. Pt will continue to benefit from physical therapy to address impairments and progress toward goals.     Rehab Potential  Good    PT Treatment/Interventions  ADLs/Self Care Home Management;Electrical Stimulation;Iontophoresis 4mg/ml Dexamethasone;Moist Heat;Traction;Gait training;Functional mobility training;Therapeutic activities;Therapeutic exercise;Neuromuscular re-education;Patient/family education;Manual techniques;Dry needling;Taping    Consulted and Agree with Plan of Care  Patient       Patient will benefit from skilled therapeutic intervention in order to improve the following deficits and impairments:  Abnormal gait, Decreased strength, Pain, Difficulty walking, Improper body mechanics, Postural dysfunction  Visit Diagnosis: Acute right-sided low back pain, with sciatica presence unspecified  Pain in right hip  Muscle weakness (generalized)  Other abnormalities of gait and mobility     Problem List Patient Active Problem List   Diagnosis Date Noted  . Primary osteoarthritis of right hip 04/01/2018  . Ulcerative colitis ([facility]) 07/17/2016  . Low testosterone 06/20/2015  . Type 2 diabetes mellitus, controlled ([facility]) 03/03/2013  .  INSOMNIA 06/17/2010  . Hypothyroidism 10/10/2009  . SHOULDER PAIN, BILATERAL 10/10/2009  . ALLERGIC RHINITIS 05/12/2008  . DERMATOPHYTOSIS OF NAIL 05/08/2008  . [place] DISEASE 05/08/2008  . Obstructive sleep   apnea 05/08/2008  . BACK PAIN 05/08/2008  . PLANTAR FASCIITIS 05/08/2008     , SPT 05/12/2018, 6:34 PM  [facility] [address]  [address] [place], [place], [address] Phone: [phone]   Fax:  [phone]  Name: John Frye MRN: [id number] Date of Birth: 01/09/1956   PHYSICAL THERAPY DISCHARGE SUMMARY  Visits from Start of Care: 8  Current functional level related to goals / functional outcomes:   Unable to formally assess as pt unable to return in >30 days due to family emergency requiring him to go out of town.   Remaining deficits:   As above.   Education / Equipment:   HEP  Plan: Patient agrees to discharge.  Patient goals were partially met. Patient is being discharged due to not returning since the last visit.  ?????     JoAnne M. Kreis, PT, MPT 06/17/18, 8:46 AM  [facility] [address]  [address] [place], [place], [address] Phone: [phone]   Fax:  [phone]     

## 2018-05-19 ENCOUNTER — Encounter: Payer: 59 | Admitting: Physical Therapy

## 2018-05-26 ENCOUNTER — Encounter: Payer: 59 | Admitting: Physical Therapy

## 2018-07-19 DIAGNOSIS — Z23 Encounter for immunization: Secondary | ICD-10-CM | POA: Diagnosis not present

## 2018-07-21 ENCOUNTER — Other Ambulatory Visit: Payer: Self-pay | Admitting: Family Medicine

## 2018-07-28 IMAGING — DX DG HIP (WITH OR WITHOUT PELVIS) 2-3V*R*
3 series · 3 of 3 positions shown · non-contrast
Comparison: None.

CLINICAL DATA: Chronic right hip and groin pain.  No known injury.

EXAM:
DG HIP (WITH OR WITHOUT PELVIS) 2-3V RIGHT

[hip joint ap]
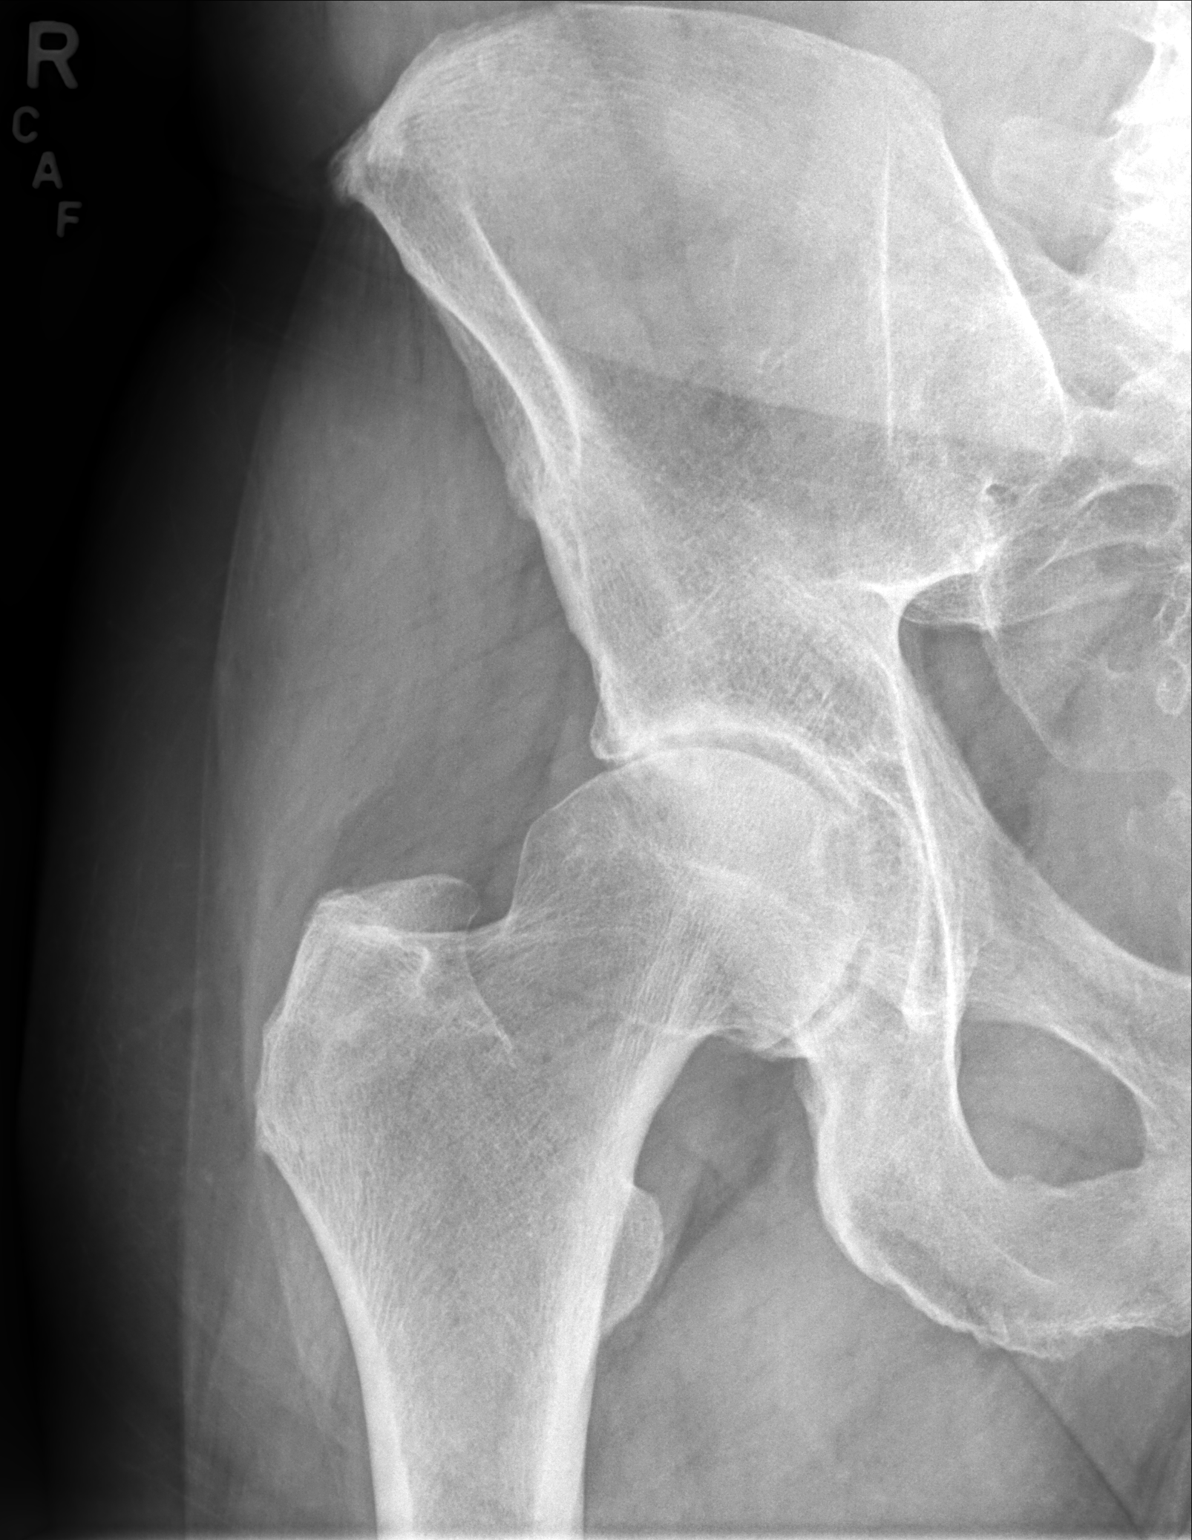

[pelvis ap]
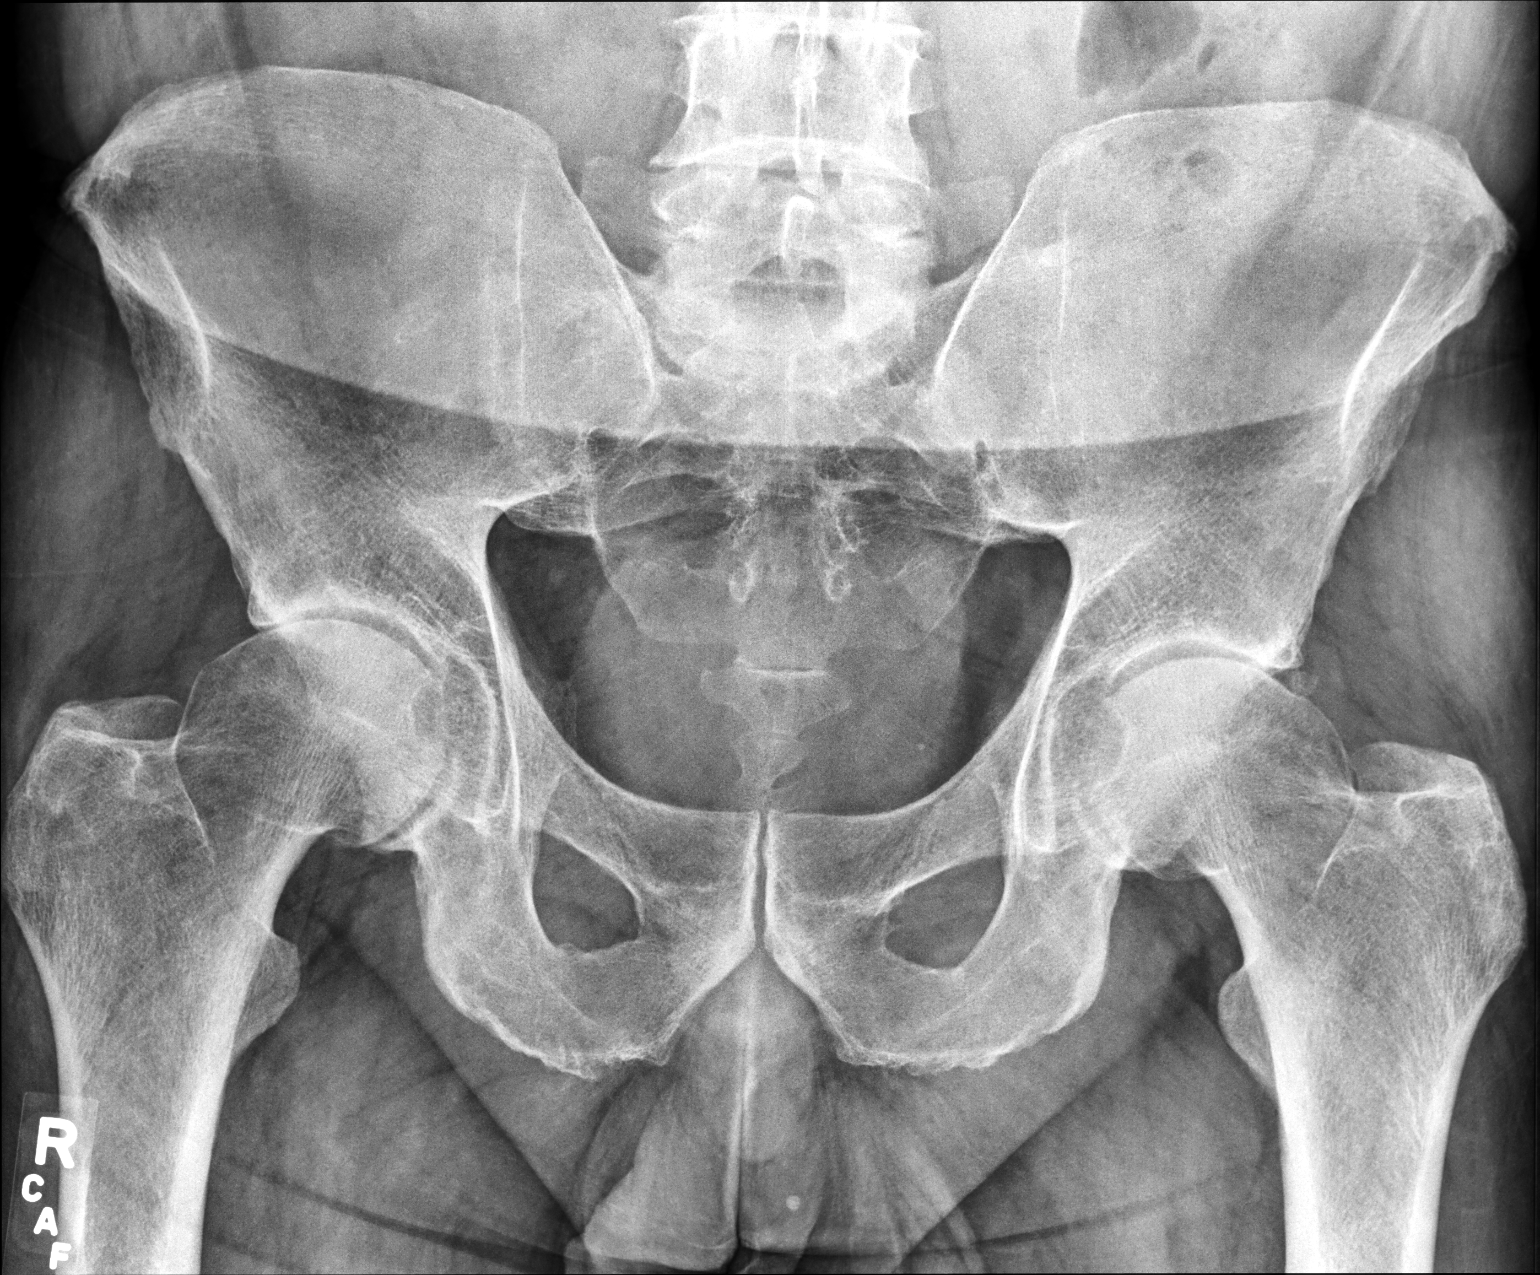

[hip joint (frog view)]
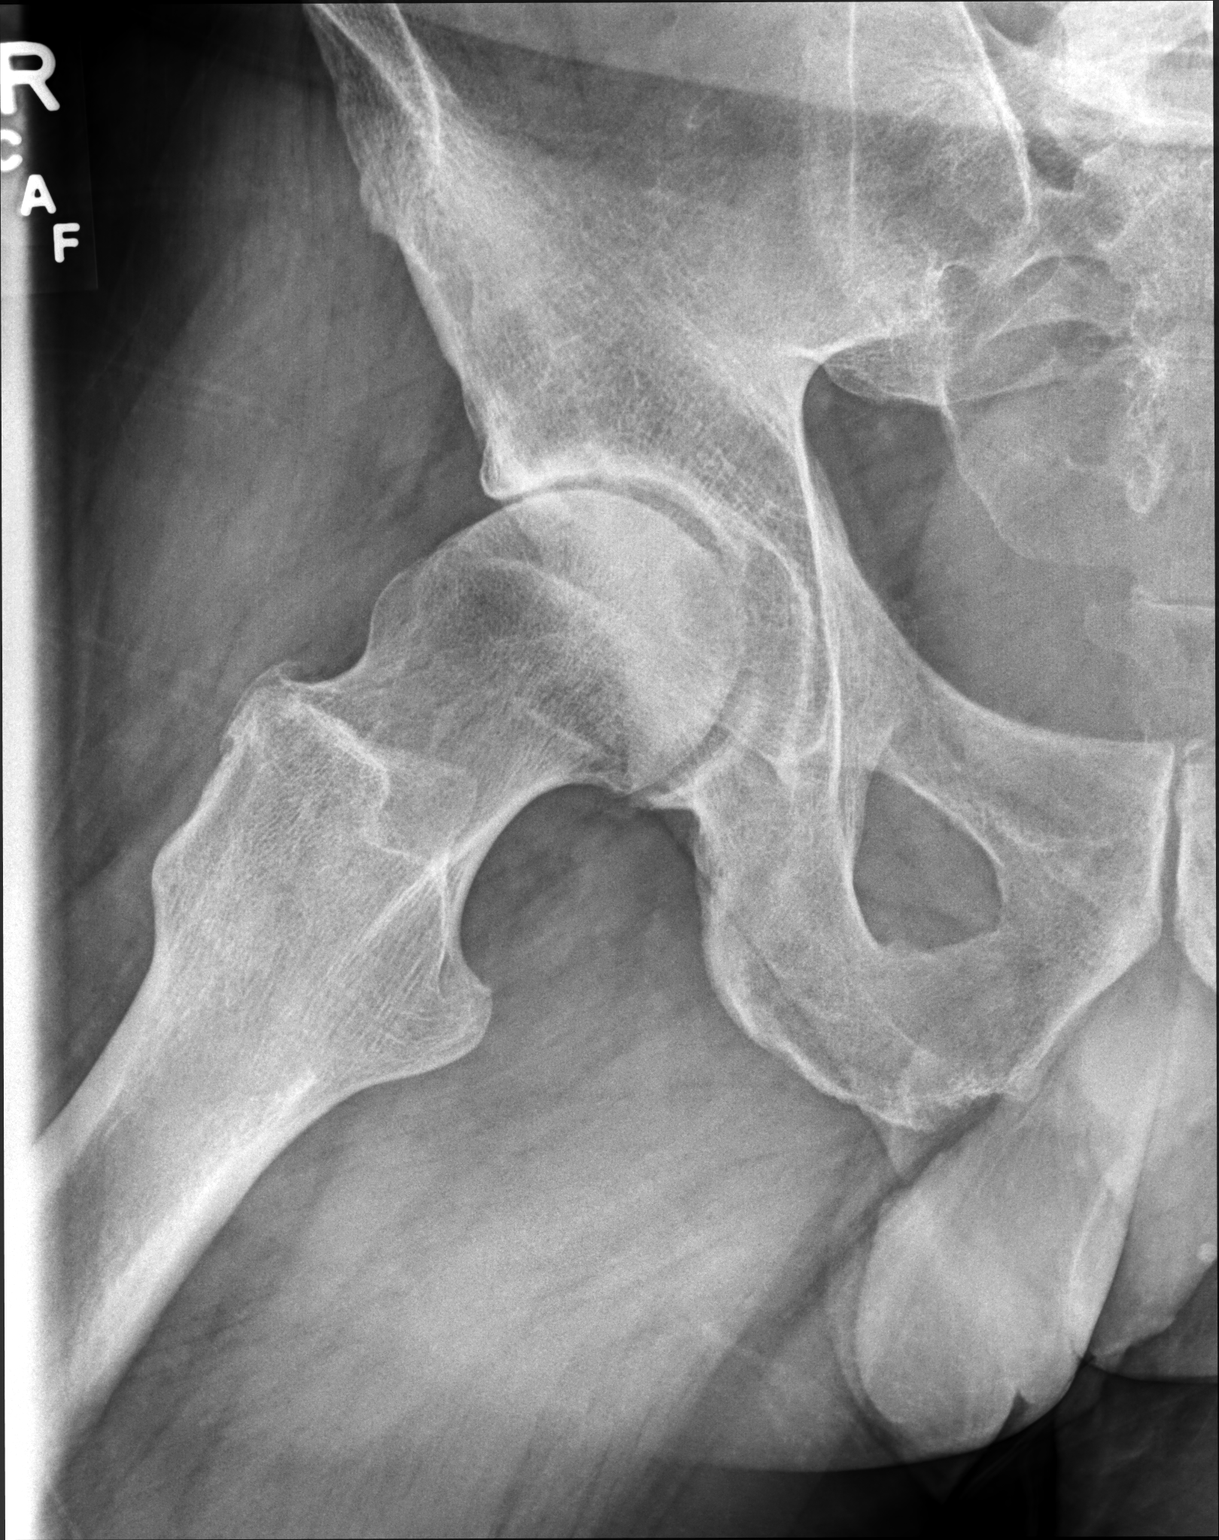

[3 of 3 positions shown; findings below may reference images not displayed]

FINDINGS: Degenerative changes are seen in both hips with mild narrowing of
joint space. Osteophytes associated with the femoral head/neck is
bilaterally and symmetrically. No acute fracture is seen. No
dislocation.
IMPRESSION: Bilateral degenerative changes, right greater than left.

## 2018-09-16 ENCOUNTER — Other Ambulatory Visit: Payer: Self-pay

## 2018-09-16 ENCOUNTER — Ambulatory Visit: Payer: 59 | Admitting: Family Medicine

## 2018-09-16 ENCOUNTER — Encounter: Payer: Self-pay | Admitting: Family Medicine

## 2018-09-16 VITALS — BP 104/70 | HR 79 | Temp 98.4°F | Wt 255.0 lb

## 2018-09-16 DIAGNOSIS — M79631 Pain in right forearm: Secondary | ICD-10-CM | POA: Diagnosis not present

## 2018-09-16 DIAGNOSIS — M67479 Ganglion, unspecified ankle and foot: Secondary | ICD-10-CM | POA: Diagnosis not present

## 2018-09-16 DIAGNOSIS — E119 Type 2 diabetes mellitus without complications: Secondary | ICD-10-CM | POA: Diagnosis not present

## 2018-09-16 DIAGNOSIS — Z23 Encounter for immunization: Secondary | ICD-10-CM

## 2018-09-16 LAB — POCT GLYCOSYLATED HEMOGLOBIN (HGB A1C): Hemoglobin A1C: 7.3 % — AB (ref 4.0–5.6)

## 2018-09-16 MED ORDER — EMPAGLIFLOZIN 10 MG PO TABS: 10.0000 mg | ORAL_TABLET | Freq: Every day | ORAL | 1 refills | Status: DC

## 2018-09-16 NOTE — Progress Notes (Signed)
Subjective:     Patient ID: John Frye, male   DOB: 05-18-1956, 62 y.o.   MRN: [id number]  HPI Patient seen today for several issues as follows  Type 2 diabetes.  History of suboptimal control.  Last A1c 7.3%.  Remains on metformin.  Not exercising.  Poor compliance with diet at times.  Not monitoring blood sugars regularly.  Weight overall stable.  Cystic lesion right great toe.  This has been growing some in size and starting to irritate because of rubbing against the shoe.  He stuck a needle in this once and drained some clear mucinous fluid out but swelling recurred.  Third issue is some right forearm pain.  He was concerned about possible "carpal tunnel".  He uses a mouse with his computer frequently.  No known injury.  Pain is along the mid to proximal third of the dorsal aspect of the forearm.  Occasional sharp pain in the elbow.  He has not had any hand numbness or weakness and no hand or wrist pain.  Patient also needs shingles vaccine.  He has checked already on insurance coverage.  Past Medical History:  Diagnosis Date  . ALLERGIC RHINITIS 05/12/2008  . BACK PAIN 05/08/2008  . Dermatophytosis of nail 05/08/2008  . [place] DISEASE 05/08/2008  . HYPOTHYROIDISM 10/10/2009  . INSOMNIA 06/17/2010  . OBSTRUCTIVE SLEEP APNEA 05/08/2008  . PLANTAR FASCIITIS 05/08/2008  . PREDIABETES 09/18/2010  . SHOULDER PAIN, BILATERAL 10/10/2009   Past Surgical History:  Procedure Laterality Date  . TONSILLECTOMY      reports that he has never smoked. He has never used smokeless tobacco. No history on file for alcohol and drug. family history includes Stroke (age of onset: 2) in his father. No Known Allergies   Review of Systems  Constitutional: Negative for fatigue and unexpected weight change.  Eyes: Negative for visual disturbance.  Respiratory: Negative for cough, chest tightness and shortness of breath.   Cardiovascular: Negative for chest pain, palpitations and leg swelling.  Neurological:  Negative for dizziness, syncope, weakness, light-headedness and headaches.       Objective:   Physical Exam Constitutional:      Appearance: Normal appearance.  Cardiovascular:     Rate and Rhythm: Normal rate and regular rhythm.     Pulses: Normal pulses.     Heart sounds: Normal heart sounds.  Pulmonary:     Effort: Pulmonary effort is normal.     Breath sounds: Normal breath sounds.  Musculoskeletal:        General: No swelling.     Comments: No tenderness over the medial or lateral epicondylar region.  He has full range of motion elbow and wrist.  Skin:    Comments: Patient has cystic lesion left great toe along the medial aspect about 1 cm diameter.  Clear fluid in the center.  No ulceration  Neurological:     Mental Status: He is alert.     Comments: Full strength throughout upper extremities with symmetric reflexes.        Assessment:     #1 type 2 diabetes suboptimally controlled with A1c 7.3%  #2 digital mucinous cyst left great toe  #3 left forearm pain.  No evidence for lateral or medial epicondylitis.  Clinically, no evidence for carpal tunnel syndrome.    Plan:     -Shingrix vaccine given and repeat in 2 to 6 months -Long discussion regarding his diabetes management options.  He is already on metformin.  We discussed other possible agents including  SGLT2 versus GLP-1 class.  We elected to try Jardiance 10 mg daily if he can get insurance coverage.  We will plan repeat A1c in 3 months -Set up podiatry referral regarding his digital mucinous cyst which is becoming more uncomfortable -Reviewed signs and symptoms of carpal tunnel syndrome.  He may have some type of overuse right upper extremity but no clear evidence for carpal tunnel or tendinitis.  Observe for now and consider sports medicine referral if progressing  Eulas Post MD [facility]

## 2018-09-22 ENCOUNTER — Ambulatory Visit: Payer: 59 | Admitting: Podiatry

## 2018-09-22 ENCOUNTER — Encounter: Payer: Self-pay | Admitting: Podiatry

## 2018-09-22 ENCOUNTER — Ambulatory Visit (INDEPENDENT_AMBULATORY_CARE_PROVIDER_SITE_OTHER): Payer: 59

## 2018-09-22 DIAGNOSIS — M6740 Ganglion, unspecified site: Secondary | ICD-10-CM

## 2018-09-22 MED ORDER — CEPHALEXIN 500 MG PO CAPS: 500.0000 mg | ORAL_CAPSULE | Freq: Three times a day (TID) | ORAL | 0 refills | Status: DC

## 2018-09-22 MED ORDER — HYDROCODONE-ACETAMINOPHEN 5-325 MG PO TABS: 1.0000 | ORAL_TABLET | Freq: Four times a day (QID) | ORAL | 0 refills | Status: DC | PRN

## 2018-09-23 ENCOUNTER — Other Ambulatory Visit: Payer: Self-pay | Admitting: Podiatry

## 2018-09-23 DIAGNOSIS — M6740 Ganglion, unspecified site: Secondary | ICD-10-CM | POA: Diagnosis not present

## 2018-09-23 DIAGNOSIS — M67472 Ganglion, left ankle and foot: Secondary | ICD-10-CM | POA: Diagnosis not present

## 2018-09-25 NOTE — Progress Notes (Signed)
Subjective:   Patient ID: John Frye, male   DOB: 62 y.o.   MRN: [id number]   HPI 62 year old male presents the office today for concerns of a cyst on his left big toe which is been ongoing for approximately 1 year.  He states that it will occasionally drain he gets clear liquid coming from the area and it fills back up with fluid may cause irritation.  He is asking if it can be removed today.  He states it comes back about every 1 to 2 months.  He does try to drain it himself at home.  He has no other concerns today.   Review of Systems  All other systems reviewed and are negative.   Past Medical History:  Diagnosis Date  . ALLERGIC RHINITIS 05/12/2008  . BACK PAIN 05/08/2008  . Dermatophytosis of nail 05/08/2008  . [place] DISEASE 05/08/2008  . HYPOTHYROIDISM 10/10/2009  . INSOMNIA 06/17/2010  . OBSTRUCTIVE SLEEP APNEA 05/08/2008  . PLANTAR FASCIITIS 05/08/2008  . PREDIABETES 09/18/2010  . SHOULDER PAIN, BILATERAL 10/10/2009    Past Surgical History:  Procedure Laterality Date  . TONSILLECTOMY       Current Outpatient Medications:  .  cephALEXin (KEFLEX) 500 MG capsule, Take 1 capsule (500 mg total) by mouth 3 (three) times daily., Disp: 21 capsule, Rfl: 0 .  clonazePAM (KLONOPIN) 0.5 MG tablet, Take 1 tablet (0.5 mg total) by mouth at bedtime as needed., Disp: 90 tablet, Rfl: 0 .  cyclobenzaprine (FLEXERIL) 10 MG tablet, Take 1 tablet (10 mg total) by mouth 3 (three) times daily as needed for muscle spasms., Disp: 60 tablet, Rfl: 0 .  empagliflozin (JARDIANCE) 10 MG TABS tablet, Take 10 mg by mouth daily., Disp: 90 tablet, Rfl: 1 .  HYDROcodone-acetaminophen (NORCO/VICODIN) 5-325 MG tablet, Take 1 tablet by mouth every 6 (six) hours as needed., Disp: 15 tablet, Rfl: 0 .  levothyroxine (SYNTHROID, LEVOTHROID) 112 MCG tablet, TAKE 1 TABLET BY MOUTH  DAILY BEFORE BREAKFAST, Disp: 90 tablet, Rfl: 3 .  mesalamine (LIALDA) 1.2 g EC tablet, Take by mouth. Takes 2 tablets per day., Disp: , Rfl:   .  metFORMIN (GLUCOPHAGE) 500 MG tablet, TAKE 2 TABLETS BY MOUTH  TWICE A DAY WITH MEALS, Disp: 360 tablet, Rfl: 0 .  ONE TOUCH ULTRA TEST test strip, USE 1 STRIP 2 TIMES DAILY  AS NEEDED, Disp: 200 each, Rfl: 3 .  ONETOUCH DELICA LANCETS FINE MISC, 1 each by Does not apply route daily as needed., Disp: 100 each, Rfl: 6  No Known Allergies       Objective:  Physical Exam  General: AAO x3, NAD  Dermatological: There is a soft tissue mass present along the dorsal medial aspect of the left foot on the big toe on the DIPJ.  Is fluid-filled consistent with a ganglion cyst.  There is no surrounding erythema, edema.  There is noes clinical signs of infection noted today.  No other lesions are identified.  Vascular: Dorsalis Pedis artery and Posterior Tibial artery pedal pulses are 2/4 bilateral with immedate capillary fill time. There is no pain with calf compression, swelling, warmth, erythema.   Neruologic: Grossly intact via light touch bilateral. Protective threshold with Semmes Wienstein monofilament intact to all pedal sites bilateral.   Musculoskeletal: No gross boney pedal deformities bilateral. No pain, crepitus, or limitation noted with foot and ankle range of motion bilateral. Muscular strength 5/5 in all groups tested bilateral.  Gait: Unassisted, Nonantalgic.       Assessment:  Soft tissue mass left hallux    Plan:  -Treatment options discussed including all alternatives, risks, and complications -Etiology of symptoms were discussed -X-rays were obtained and reviewed with the patient.  No calcifications present or evidence of bony destruction. -We discussed her treatment options.  After discussion he wants to have the cyst removed and had to do this today.  We discussed all alternatives, risks, complications.  No promises or guarantees were given at the outcome of the procedure and all questions were answered.  Surgical consent form was signed.  The toe was anesthetized with  a mixture of 3 cc of lidocaine and Marcaine plain after the skin was prepped with alcohol.  He was then brought back to the procedure suite.  A well-padded pneumatic ankle tourniquet was placed on the left ankle.  The left lower extremity was then scrubbed, prepped, draped in normal sterile fashion.  The left lower extremity was exsanguinated and the pneumatic ankle tourniquet was inflated to 250 mmHg.  Next to some elliptical incisions were planned along the soft tissue mass and the incision was made with a 15 blade scalpel the epidermis and dermis removing a wedge of tissue as well as the soft tissue mass.  At this time the underlying tissue was identified and there is no further cystic or mass structure identified there is no further fluid.  There was clear, jellylike fluid coming from the mass consistent with a ganglion cyst.  There is no underlying signs of infection.  The incision was copiously irrigated with saline and the skin was then repaired with 4-0 nylon in a simple interrupted fashion.  Betadine was cleaned over the incision followed by Xeroform and a dry sterile dressing.  Tourniquet was released and there is found to be an immediate cap refill time to the digits.  He tolerated the procedure well and complications.  Prescribed Keflex as well as Vicodin for pain.  Surgical shoe dispensed as well.  I will see him back on Monday but I encouraged him to call any questions or concerns or any changes.  Trula Slade DPM

## 2018-09-26 ENCOUNTER — Ambulatory Visit (INDEPENDENT_AMBULATORY_CARE_PROVIDER_SITE_OTHER): Payer: Self-pay | Admitting: Podiatry

## 2018-09-26 DIAGNOSIS — M6740 Ganglion, unspecified site: Secondary | ICD-10-CM

## 2018-09-29 LAB — TISSUE SPECIMEN

## 2018-09-29 LAB — PATHOLOGY

## 2018-10-04 ENCOUNTER — Ambulatory Visit (INDEPENDENT_AMBULATORY_CARE_PROVIDER_SITE_OTHER): Payer: 59

## 2018-10-04 ENCOUNTER — Telehealth: Payer: Self-pay | Admitting: *Deleted

## 2018-10-04 DIAGNOSIS — M6740 Ganglion, unspecified site: Secondary | ICD-10-CM

## 2018-10-04 NOTE — Telephone Encounter (Signed)
-----   Message from Trula Slade, DPM sent at 10/03/2018  7:51 AM EST ----- + ganglion cyst. Please let him know. Thanks.

## 2018-10-04 NOTE — Telephone Encounter (Signed)
Left message on pt's home phone to call for results. I informed pt of Dr. Leigh Aurora review of results and pt states he will be in office today for suture removal.

## 2018-10-06 NOTE — Progress Notes (Signed)
Subjective: John Frye is a 63 y.o. is seen today in office s/p soft tissue mass excision preformed on 09/22/2018. They state their pain is controlled and he is not having any significant pain.  He is remained in the surgical shoe. Denies any systemic complaints such as fevers, chills, nausea, vomiting. No calf pain, chest pain, shortness of breath.   Objective: General: No acute distress, AAOx3  DP/PT pulses palpable 2/4, CRT < 3 sec to all digits.  Protective sensation intact. Motor function intact.  Left foot: Incision is well coapted without any evidence of dehiscence and sutures intact. There is no surrounding erythema, ascending cellulitis, fluctuance, crepitus, malodor, drainage/purulence. There is minimal edema around the surgical site. There is no pain along the surgical site.  No other areas of tenderness to bilateral lower extremities.  No other open lesions or pre-ulcerative lesions.  No pain with calf compression, swelling, warmth, erythema.   Assessment and Plan:  Status post left foot soft tissue mass excision, doing well with no complications   -Treatment options discussed including all alternatives, risks, and complications -Incisions healing well.  Antibiotic ointment and a bandage was applied.  Continue daily dressing changes but hold off on getting the area wet.  Remain in surgical shoe. -Ice/elevation -Pain medication as needed. -Monitor for any clinical signs or symptoms of infection and DVT/PE and directed to call the office immediately should any occur or go to the ER. -Follow-up in 10 days for suture removal or sooner if any problems arise. In the meantime, encouraged to call the office with any questions, concerns, change in symptoms.   Celesta Gentile, DPM

## 2018-10-12 NOTE — Progress Notes (Signed)
Patient is here today for follow-up appointment recent procedure performed on 09/22/2018, removal of cyst/soft tissue mass left hallux toe.  He states that overall he is not had any pain or soreness with the area and he has been able to keep it bandaged and not get it wet.  Patient denies fever chills nausea vomiting.  Noted well-healing surgical incision to the dorsal aspect of the left great toe.  No redness, no drainage, no erythema, very minimal swelling.  No other signs and symptoms of infection.  Sutures were removed and the wound edges were coapted and there is no gapping.  Steri-Strips were applied patient was advised to monitor for signs and symptoms of infection.  He is to follow-up as needed with any acute symptom changes.

## 2018-10-14 ENCOUNTER — Other Ambulatory Visit: Payer: Self-pay | Admitting: Family Medicine

## 2018-11-03 DIAGNOSIS — H00011 Hordeolum externum right upper eyelid: Secondary | ICD-10-CM | POA: Diagnosis not present

## 2018-11-27 ENCOUNTER — Other Ambulatory Visit: Payer: Self-pay | Admitting: Family Medicine

## 2018-11-29 DIAGNOSIS — H2513 Age-related nuclear cataract, bilateral: Secondary | ICD-10-CM | POA: Diagnosis not present

## 2018-11-29 DIAGNOSIS — H40012 Open angle with borderline findings, low risk, left eye: Secondary | ICD-10-CM | POA: Diagnosis not present

## 2018-11-29 DIAGNOSIS — E119 Type 2 diabetes mellitus without complications: Secondary | ICD-10-CM | POA: Diagnosis not present

## 2018-11-29 LAB — HM DIABETES EYE EXAM

## 2018-12-16 ENCOUNTER — Other Ambulatory Visit: Payer: Self-pay

## 2018-12-16 ENCOUNTER — Ambulatory Visit: Payer: 59 | Admitting: Family Medicine

## 2018-12-16 ENCOUNTER — Encounter: Payer: Self-pay | Admitting: Family Medicine

## 2018-12-16 VITALS — BP 128/78 | HR 72 | Temp 98.4°F | Ht 73.0 in | Wt 244.3 lb

## 2018-12-16 DIAGNOSIS — E038 Other specified hypothyroidism: Secondary | ICD-10-CM

## 2018-12-16 DIAGNOSIS — K51919 Ulcerative colitis, unspecified with unspecified complications: Secondary | ICD-10-CM | POA: Diagnosis not present

## 2018-12-16 DIAGNOSIS — E119 Type 2 diabetes mellitus without complications: Secondary | ICD-10-CM

## 2018-12-16 LAB — POCT GLYCOSYLATED HEMOGLOBIN (HGB A1C): Hemoglobin A1C: 7.1 % — AB (ref 4.0–5.6)

## 2018-12-16 MED ORDER — ONETOUCH DELICA LANCETS 33G MISC: 3 refills | Status: DC

## 2018-12-16 MED ORDER — GLUCOSE BLOOD VI STRP: ORAL_STRIP | 3 refills | Status: DC

## 2018-12-16 MED ORDER — LEVOTHYROXINE SODIUM 112 MCG PO TABS: 112.0000 ug | ORAL_TABLET | Freq: Every day | ORAL | 1 refills | Status: DC

## 2018-12-16 MED ORDER — METFORMIN HCL 500 MG PO TABS: ORAL_TABLET | ORAL | 1 refills | Status: DC

## 2018-12-16 MED ORDER — EMPAGLIFLOZIN 10 MG PO TABS: 10.0000 mg | ORAL_TABLET | Freq: Every day | ORAL | 1 refills | Status: DC

## 2018-12-16 NOTE — Patient Instructions (Signed)
A1C 7.1%    Increase exercise and lose some weight  Let's plan on physical exam at follow up in a few months.

## 2018-12-16 NOTE — Progress Notes (Signed)
  Subjective:     Patient ID: John Frye, male   DOB: 23-Oct-1955, 63 y.o.   MRN: [id number]  HPI Follow-up type 2 diabetes.  Last A1c 7.3%.  He has managed to lose some weight since then.  His blood sugars been slightly improved by home readings.  He remains on combination therapy with Jardiance and metformin. He needs refills of several things today including lancets and test strips.  He has history of hypothyroidism and is on levothyroxine.  Compliant with therapy.  He is due for labs but would like to schedule complete physical to get those labs soon.  He has ulcerative colitis and is followed by GI for that.  Is on Lialda which seems to be controlling symptoms apparently fairly well.  Past Medical History:  Diagnosis Date  . ALLERGIC RHINITIS 05/12/2008  . BACK PAIN 05/08/2008  . Dermatophytosis of nail 05/08/2008  . [place] DISEASE 05/08/2008  . HYPOTHYROIDISM 10/10/2009  . INSOMNIA 06/17/2010  . OBSTRUCTIVE SLEEP APNEA 05/08/2008  . PLANTAR FASCIITIS 05/08/2008  . PREDIABETES 09/18/2010  . SHOULDER PAIN, BILATERAL 10/10/2009   Past Surgical History:  Procedure Laterality Date  . CYST REMOVAL LEG     Right toe  . TONSILLECTOMY      reports that he has never smoked. He has never used smokeless tobacco. He reports current alcohol use of about 4.0 standard drinks of alcohol per week. He reports that he does not use drugs. family history includes Stroke (age of onset: 54) in his father. No Known Allergies   Review of Systems  Constitutional: Negative for fatigue and unexpected weight change.  Eyes: Negative for visual disturbance.  Respiratory: Negative for cough, chest tightness and shortness of breath.   Cardiovascular: Negative for chest pain, palpitations and leg swelling.  Endocrine: Negative for polydipsia and polyuria.  Neurological: Negative for dizziness, syncope, weakness, light-headedness and headaches.       Objective:   Physical Exam Constitutional:      Appearance:  He is well-developed.  HENT:     Right Ear: External ear normal.     Left Ear: External ear normal.  Eyes:     Pupils: Pupils are equal, round, and reactive to light.  Neck:     Musculoskeletal: Neck supple.     Thyroid: No thyromegaly.  Cardiovascular:     Rate and Rhythm: Normal rate and regular rhythm.  Pulmonary:     Effort: Pulmonary effort is normal. No respiratory distress.     Breath sounds: Normal breath sounds. No wheezing or rales.  Neurological:     Mental Status: He is alert and oriented to person, place, and time.        Assessment:     #1 type 2 diabetes slightly improved with A1c 7.1%  #2 hypothyroidism  #3 ulcerative colitis      Plan:     -Patient needs follow up labs including thyroid functions.  We advised setting up complete physical in the next 2 to 3 months and obtain full labs at that point -Refill sent for lancets, test strips, and all of his regular medications -we have challenged him to try to lose more weight and A1C should come to goal.    Eulas Post MD [facility] at [facility]

## 2019-03-31 ENCOUNTER — Encounter: Payer: Self-pay | Admitting: Family Medicine

## 2019-03-31 ENCOUNTER — Other Ambulatory Visit: Payer: Self-pay

## 2019-03-31 ENCOUNTER — Ambulatory Visit (INDEPENDENT_AMBULATORY_CARE_PROVIDER_SITE_OTHER): Payer: 59 | Admitting: Family Medicine

## 2019-03-31 VITALS — BP 120/72 | HR 74 | Temp 98.3°F | Ht 73.0 in | Wt 238.8 lb

## 2019-03-31 DIAGNOSIS — Z0000 Encounter for general adult medical examination without abnormal findings: Secondary | ICD-10-CM | POA: Diagnosis not present

## 2019-03-31 DIAGNOSIS — Z23 Encounter for immunization: Secondary | ICD-10-CM

## 2019-03-31 LAB — BASIC METABOLIC PANEL
BUN: 13 mg/dL (ref 6–23)
CO2: 29 mEq/L (ref 19–32)
Calcium: 9.6 mg/dL (ref 8.4–10.5)
Chloride: 101 mEq/L (ref 96–112)
Creatinine, Ser: 0.7 mg/dL (ref 0.40–1.50)
GFR: 114.64 mL/min (ref >60.00)
Glucose, Bld: 113 mg/dL — ABNORMAL HIGH (ref 70–99)
Potassium: 4.2 mEq/L (ref 3.5–5.1)
Sodium: 139 mEq/L (ref 135–145)

## 2019-03-31 LAB — CBC WITH DIFFERENTIAL/PLATELET
Basophils Absolute: 0 10*3/uL (ref 0.0–0.1)
Basophils Relative: 0.5 % (ref 0.0–3.0)
Eosinophils Absolute: 0 10*3/uL (ref 0.0–0.7)
Eosinophils Relative: 0.4 % (ref 0.0–5.0)
HCT: 43.9 % (ref 39.0–52.0)
Hemoglobin: 14.8 g/dL (ref 13.0–17.0)
Lymphocytes Relative: 30.2 % (ref 12.0–46.0)
Lymphs Abs: 2.4 10*3/uL (ref 0.7–4.0)
MCHC: 33.7 g/dL (ref 30.0–36.0)
MCV: 88.6 fl (ref 78.0–100.0)
Monocytes Absolute: 0.5 10*3/uL (ref 0.1–1.0)
Monocytes Relative: 6.5 % (ref 3.0–12.0)
Neutro Abs: 5 10*3/uL (ref 1.4–7.7)
Neutrophils Relative %: 62.4 % (ref 43.0–77.0)
Platelets: 301 10*3/uL (ref 150.0–400.0)
RBC: 4.95 Mil/uL (ref 4.22–5.81)
RDW: 13.7 % (ref 11.5–15.5)
WBC: 8 10*3/uL (ref 4.0–10.5)

## 2019-03-31 LAB — HEPATIC FUNCTION PANEL
ALT: 15 U/L (ref 0–53)
AST: 11 U/L (ref 0–37)
Albumin: 4.6 g/dL (ref 3.5–5.2)
Alkaline Phosphatase: 64 U/L (ref 39–117)
Bilirubin, Direct: 0.1 mg/dL (ref 0.0–0.3)
Total Bilirubin: 0.6 mg/dL (ref 0.2–1.2)
Total Protein: 6.7 g/dL (ref 6.0–8.3)

## 2019-03-31 LAB — HEMOGLOBIN A1C: Hgb A1c MFr Bld: 6.6 % — ABNORMAL HIGH (ref 4.6–6.5)

## 2019-03-31 LAB — MICROALBUMIN / CREATININE URINE RATIO
Creatinine,U: 44.2 mg/dL
Microalb Creat Ratio: 1.6 mg/g (ref 0.0–30.0)
Microalb, Ur: <0.7 mg/dL (ref 0.0–1.9)

## 2019-03-31 LAB — LIPID PANEL
Cholesterol: 175 mg/dL (ref 0–200)
HDL: 51.7 mg/dL (ref >39.00)
LDL Cholesterol: 103 mg/dL — ABNORMAL HIGH (ref 0–99)
NonHDL: 122.9
Total CHOL/HDL Ratio: 3
Triglycerides: 100 mg/dL (ref 0.0–149.0)
VLDL: 20 mg/dL (ref 0.0–40.0)

## 2019-03-31 LAB — TSH: TSH: 3.11 u[IU]/mL (ref 0.35–4.50)

## 2019-03-31 LAB — PSA: PSA: 0.29 ng/mL (ref 0.10–4.00)

## 2019-03-31 NOTE — Progress Notes (Signed)
Subjective:     Patient ID: John Frye, male   DOB: [birth year], 63 y.o.   MRN: [id number]  HPI Here for physical exam.  His chronic problems include history of obstructive sleep apnea, ulcerative colitis, hypothyroidism, type 2 diabetes.  He is getting yearly eye exams.  Needs follow-up urine microalbumin.  Needs Pneumovax.  Also needs hepatitis C screening.  His colonoscopy is up-to-date.  Will need tetanus booster next year.  He has lost little bit of weight recently due to his efforts.  Is very active with gardening.  Last A1c 7.1%  Past Medical History:  Diagnosis Date  . ALLERGIC RHINITIS 05/12/2008  . BACK PAIN 05/08/2008  . Dermatophytosis of nail 05/08/2008  . [place] DISEASE 05/08/2008  . HYPOTHYROIDISM 10/10/2009  . INSOMNIA 06/17/2010  . OBSTRUCTIVE SLEEP APNEA 05/08/2008  . PLANTAR FASCIITIS 05/08/2008  . PREDIABETES 09/18/2010  . SHOULDER PAIN, BILATERAL 10/10/2009   Past Surgical History:  Procedure Laterality Date  . CYST REMOVAL LEG     Right toe  . TONSILLECTOMY      reports that he has never smoked. He has never used smokeless tobacco. He reports current alcohol use of about 4.0 standard drinks of alcohol per week. He reports that he does not use drugs. family history includes Stroke (age of onset: 24) in his father. No Known Allergies     Review of Systems  Constitutional: Negative for activity change, appetite change, fatigue and fever.  HENT: Negative for congestion, ear pain and trouble swallowing.   Eyes: Negative for pain and visual disturbance.  Respiratory: Negative for cough, shortness of breath and wheezing.   Cardiovascular: Negative for chest pain and palpitations.  Gastrointestinal: Negative for abdominal distention, abdominal pain, blood in stool, constipation, diarrhea, nausea, rectal pain and vomiting.  Genitourinary: Negative for dysuria, hematuria and testicular pain.  Musculoskeletal: Negative for arthralgias and joint swelling.  Skin: Negative  for rash.  Neurological: Negative for dizziness, syncope and headaches.  Hematological: Negative for adenopathy.  Psychiatric/Behavioral: Negative for confusion and dysphoric mood.       Objective:   Physical Exam Constitutional:      General: He is not in acute distress.    Appearance: He is well-developed.  HENT:     Head: Normocephalic and atraumatic.     Right Ear: External ear normal.     Left Ear: External ear normal.  Eyes:     Conjunctiva/sclera: Conjunctivae normal.     Pupils: Pupils are equal, round, and reactive to light.  Neck:     Musculoskeletal: Normal range of motion and neck supple.     Thyroid: No thyromegaly.  Cardiovascular:     Rate and Rhythm: Normal rate and regular rhythm.     Heart sounds: Normal heart sounds. No murmur.  Pulmonary:     Effort: No respiratory distress.     Breath sounds: No wheezing or rales.  Abdominal:     General: Bowel sounds are normal. There is no distension.     Palpations: Abdomen is soft. There is no mass.     Tenderness: There is no abdominal tenderness. There is no guarding or rebound.  Lymphadenopathy:     Cervical: No cervical adenopathy.  Skin:    Findings: No rash.  Neurological:     Mental Status: He is alert and oriented to person, place, and time.     Cranial Nerves: No cranial nerve deficit.     Deep Tendon Reflexes: Reflexes normal.  Assessment:     Physical exam.  We discussed the following health maintenance issues    Plan:     -Pneumovax given and recommend continued yearly flu vaccine -Check hepatitis C antibody -Check urine microalbumin along with A1c -Continue with yearly eye exam -Obtain other screening labs today including PSA -Tetanus booster by next year  Eulas Post MD [facility] at [facility]'

## 2019-03-31 NOTE — Addendum Note (Signed)
Addended by: Anibal Henderson on: 03/31/2019 02:10 PM   Modules accepted: Orders

## 2019-04-03 ENCOUNTER — Encounter: Payer: Self-pay | Admitting: Family Medicine

## 2019-04-03 LAB — HEPATITIS C ANTIBODY
Hepatitis C Ab: NONREACTIVE
SIGNAL TO CUT-OFF: 0.01 (ref <1.00)

## 2019-08-02 ENCOUNTER — Other Ambulatory Visit: Payer: Self-pay | Admitting: Family Medicine

## 2019-08-23 ENCOUNTER — Telehealth: Payer: Self-pay | Admitting: Family Medicine

## 2019-08-23 ENCOUNTER — Other Ambulatory Visit: Payer: Self-pay

## 2019-08-23 MED ORDER — JARDIANCE 10 MG PO TABS: 10.0000 mg | ORAL_TABLET | Freq: Every day | ORAL | 1 refills | Status: DC

## 2019-08-23 MED ORDER — LEVOTHYROXINE SODIUM 112 MCG PO TABS: 112.0000 ug | ORAL_TABLET | Freq: Every day | ORAL | 1 refills | Status: DC

## 2019-08-23 NOTE — Telephone Encounter (Signed)
Both medications have been sent to the [facility] for patient.

## 2019-08-23 NOTE — Telephone Encounter (Signed)
rxrefill empagliflozin (JARDIANCE) 10 MG TABS tablet  levothyroxine (SYNTHROID, LEVOTHROID) 112 MCG tablet  [place], [place] - [address] [phone] (Phone) [phone] (Fax)

## 2019-11-02 ENCOUNTER — Encounter: Payer: Self-pay | Admitting: Family Medicine

## 2019-12-08 ENCOUNTER — Encounter: Payer: Self-pay | Admitting: Family Medicine

## 2019-12-08 ENCOUNTER — Ambulatory Visit (INDEPENDENT_AMBULATORY_CARE_PROVIDER_SITE_OTHER): Payer: 59 | Admitting: Family Medicine

## 2019-12-08 ENCOUNTER — Other Ambulatory Visit: Payer: Self-pay

## 2019-12-08 VITALS — BP 114/68 | HR 86 | Temp 97.7°F | Ht 73.0 in | Wt 239.2 lb

## 2019-12-08 DIAGNOSIS — E038 Other specified hypothyroidism: Secondary | ICD-10-CM | POA: Diagnosis not present

## 2019-12-08 DIAGNOSIS — E119 Type 2 diabetes mellitus without complications: Secondary | ICD-10-CM | POA: Diagnosis not present

## 2019-12-08 DIAGNOSIS — M1611 Unilateral primary osteoarthritis, right hip: Secondary | ICD-10-CM

## 2019-12-08 DIAGNOSIS — Z23 Encounter for immunization: Secondary | ICD-10-CM | POA: Diagnosis not present

## 2019-12-08 NOTE — Patient Instructions (Signed)
I WILL SET UP ORTHO REFERRAL AFTER I HEAR BACK ABOUT YOUR PREFERENCE

## 2019-12-08 NOTE — Progress Notes (Signed)
Subjective:     Patient ID: John Frye, male   DOB: 1956/03/22, 64 y.o.   MRN: [id number]  HPI [name] is seen for the following issues  Type 2 diabetes.  He has had history of fairly good control.  His last A1c was 6.6%.  He remains on Metformin and Jardiance.  Needs follow-up A1c.  He has history of hypothyroidism.  He had Graves' disease several years ago and ablation of thyroid has been replacement since then.  He is concerned he may be over replaced as he has had some recent increased appetite but not gaining any weight.  No diarrhea symptoms.  Last TSH was at goal.  He had some increased sedation after meals.  He does have obstructive sleep apnea history but is not using CPAP.  Major complaint is pain right pelvic and hip area.  He had x-rays couple years ago that showed fairly advanced degenerative changes right greater than left.  He had a couple episodes recently where he was going downstairs and felt like his right lower extremity gave way.  He has had some mild back pain.  No classic radiculitis symptoms.  He had first Shingrix vaccine over a year ago but never got a second 1.  Past Medical History:  Diagnosis Date  . ALLERGIC RHINITIS 05/12/2008  . BACK PAIN 05/08/2008  . Dermatophytosis of nail 05/08/2008  . [place] DISEASE 05/08/2008  . HYPOTHYROIDISM 10/10/2009  . INSOMNIA 06/17/2010  . OBSTRUCTIVE SLEEP APNEA 05/08/2008  . PLANTAR FASCIITIS 05/08/2008  . PREDIABETES 09/18/2010  . SHOULDER PAIN, BILATERAL 10/10/2009   Past Surgical History:  Procedure Laterality Date  . CYST REMOVAL LEG     Right toe  . TONSILLECTOMY      reports that he has never smoked. He has never used smokeless tobacco. He reports current alcohol use of about 4.0 standard drinks of alcohol per week. He reports that he does not use drugs. family history includes Stroke (age of onset: 22) in his father. No Known Allergies   Review of Systems  Constitutional: Negative for fatigue.  Eyes: Negative for  visual disturbance.  Respiratory: Negative for cough, chest tightness and shortness of breath.   Cardiovascular: Negative for chest pain, palpitations and leg swelling.  Endocrine: Negative for polydipsia and polyuria.  Neurological: Negative for dizziness, syncope, weakness, light-headedness and headaches.       Objective:   Physical Exam Vitals reviewed.  Constitutional:      Appearance: Normal appearance.  Cardiovascular:     Rate and Rhythm: Normal rate and regular rhythm.  Pulmonary:     Effort: Pulmonary effort is normal.     Breath sounds: Normal breath sounds.  Musculoskeletal:     Right lower leg: No edema.     Left lower leg: No edema.     Comments: Patient has some pain with internal rotation of right hip and some slight restriction of range of motion.  Neurological:     Mental Status: He is alert.        Assessment:     #1 type 2 diabetes.  History of good control  #2 hypothyroidism on replacement.  Patient has concerns as above with recent increased caloric intake without weight gain  #3 right hip and back pain.  Suspect this is probably hip related.  He has known degenerative changes right hip and suspect progression  #4 need for second shingrix vaccine.    Plan:     -Recheck A1c along with TSH -We will set  up orthopedic referral regarding his right hip issue.  He will check with his wife regarding preference first and then go from there -Shingrix vaccine given.    Eulas Post MD [facility] at [facility]

## 2019-12-09 LAB — HEMOGLOBIN A1C
Hgb A1c MFr Bld: 7.8 % of total Hgb — ABNORMAL HIGH (ref <5.7)
Mean Plasma Glucose: 177 (calc)
eAG (mmol/L): 9.8 (calc)

## 2019-12-09 LAB — TSH: TSH: 3.22 mIU/L (ref 0.40–4.50)

## 2019-12-21 ENCOUNTER — Telehealth: Payer: Self-pay | Admitting: Family Medicine

## 2019-12-21 DIAGNOSIS — M25559 Pain in unspecified hip: Secondary | ICD-10-CM

## 2019-12-21 NOTE — Telephone Encounter (Signed)
Pt stated that [facility] was going to refer him to an orthopedic. He would like to go to anyone at [facility]. Phone: [phone]  Pt can be reached at [phone]

## 2019-12-21 NOTE — Telephone Encounter (Signed)
Referral placed per office visit note 12/08/19.

## 2019-12-27 ENCOUNTER — Encounter: Payer: Self-pay | Admitting: Family Medicine

## 2020-01-27 ENCOUNTER — Other Ambulatory Visit: Payer: Self-pay | Admitting: Family Medicine

## 2020-01-29 ENCOUNTER — Other Ambulatory Visit: Payer: Self-pay | Admitting: Family Medicine

## 2020-02-14 ENCOUNTER — Telehealth: Payer: Self-pay | Admitting: Family Medicine

## 2020-02-14 NOTE — Telephone Encounter (Signed)
Pt would like to know if he needs an appt to discuss steroid medication and possibly have blood work? Pt is currently on medication and would like to have Dr. Erick Blinks opinion. I have scheduled an appt for this Friday and pt requested a message be sent just in case Dr. Elease Hashimoto didn't want to see him in person and just wanted to call him. Thanks

## 2020-02-14 NOTE — Telephone Encounter (Signed)
Pt has been told to keep appt

## 2020-02-14 NOTE — Telephone Encounter (Signed)
Keep follow-up

## 2020-02-14 NOTE — Telephone Encounter (Signed)
Please advise if pt should keep appt

## 2020-02-15 ENCOUNTER — Other Ambulatory Visit: Payer: Self-pay

## 2020-02-16 ENCOUNTER — Encounter: Payer: Self-pay | Admitting: Family Medicine

## 2020-02-16 ENCOUNTER — Ambulatory Visit (INDEPENDENT_AMBULATORY_CARE_PROVIDER_SITE_OTHER): Payer: 59 | Admitting: Family Medicine

## 2020-02-16 VITALS — BP 110/62 | HR 70 | Temp 97.7°F | Wt 231.7 lb

## 2020-02-16 DIAGNOSIS — E119 Type 2 diabetes mellitus without complications: Secondary | ICD-10-CM | POA: Diagnosis not present

## 2020-02-16 LAB — POCT GLYCOSYLATED HEMOGLOBIN (HGB A1C): Hemoglobin A1C: 7.2 % — AB (ref 4.0–5.6)

## 2020-02-16 MED ORDER — GLIMEPIRIDE 2 MG PO TABS: 2.0000 mg | ORAL_TABLET | Freq: Every day | ORAL | 3 refills | Status: DC

## 2020-02-16 NOTE — Progress Notes (Signed)
  Subjective:     Patient ID: John Frye, male   DOB: 1955/10/22, 64 y.o.   MRN: [id number]  HPI   [name] is seen for diabetes follow-up.  His last A1c was 7.8%.  He has been diligent with dietary change and has lost about 8 pounds since last visit.  However, he has had flareup of his ulcerative colitis.  He  recently saw his gastroenterologist with [facility] and was placed on prednisone currently 40 mg daily.  This has predictably increased his blood sugars.  He had fasting this morning 179.  He remains on Jardiance and Metformin.  They are looking at using a biologic medication Weyman Rodney) if he can get coverage.  Denies any significant polyuria or polydipsia.  Past Medical History:  Diagnosis Date  . ALLERGIC RHINITIS 05/12/2008  . BACK PAIN 05/08/2008  . Dermatophytosis of nail 05/08/2008  . [place] DISEASE 05/08/2008  . HYPOTHYROIDISM 10/10/2009  . INSOMNIA 06/17/2010  . OBSTRUCTIVE SLEEP APNEA 05/08/2008  . PLANTAR FASCIITIS 05/08/2008  . PREDIABETES 09/18/2010  . SHOULDER PAIN, BILATERAL 10/10/2009   Past Surgical History:  Procedure Laterality Date  . CYST REMOVAL LEG     Right toe  . TONSILLECTOMY      reports that he has never smoked. He has never used smokeless tobacco. He reports current alcohol use of about 4.0 standard drinks of alcohol per week. He reports that he does not use drugs. family history includes Stroke (age of onset: 18) in his father. No Known Allergies   Review of Systems  Constitutional: Negative for chills, fatigue and fever.  Eyes: Negative for visual disturbance.  Respiratory: Negative for cough, chest tightness and shortness of breath.   Cardiovascular: Negative for chest pain, palpitations and leg swelling.  Endocrine: Negative for polydipsia and polyuria.  Neurological: Negative for dizziness, syncope, weakness, light-headedness and headaches.       Objective:   Physical Exam Vitals reviewed.  Constitutional:      Appearance: Normal appearance.   Cardiovascular:     Rate and Rhythm: Normal rate and regular rhythm.  Pulmonary:     Effort: Pulmonary effort is normal.     Breath sounds: Normal breath sounds.  Neurological:     Mental Status: He is alert.        Assessment:     #1 type 2 diabetes.  Improved with A1c 7.2%.  Would likely be even further improved if he had not been on recent prednisone.  His blood sugars acutely have exacerbated since starting the prednisone  #2 ulcerative colitis with severe flareup recently    Plan:     -Start short-term use of Amaryl 2 mg daily and if fasting blood sugars over the next week consistently over 160 or increase to 4 mg daily.  He will plan to taper off this after he comes off the prednisone  -He is looking at possible transfer to GI doctor closer to home here and would also like to explore whether the Entyvio could be given at an infusion center here locally  Eulas Post MD [facility] at [facility]

## 2020-02-16 NOTE — Patient Instructions (Signed)
Start the Glimepiride 2 mg one daily with breakfast  .     If glucose fasting is consistently > 160 after one week increase to 4 mg daily.

## 2020-02-20 ENCOUNTER — Other Ambulatory Visit: Payer: Self-pay | Admitting: Family Medicine

## 2020-02-20 ENCOUNTER — Telehealth: Payer: Self-pay | Admitting: Family Medicine

## 2020-02-20 DIAGNOSIS — K51919 Ulcerative colitis, unspecified with unspecified complications: Secondary | ICD-10-CM

## 2020-02-20 NOTE — Telephone Encounter (Signed)
patient  need a  Referral  For  A second opinion to [facility]  for ulcerative colitis Ronald Lobo, MD

## 2020-02-20 NOTE — Telephone Encounter (Signed)
Go ahead and set up referral as requested to Dr. Delma Officer with [facility].

## 2020-02-20 NOTE — Telephone Encounter (Signed)
Referral has been placed. 

## 2020-02-20 NOTE — Telephone Encounter (Signed)
Please advise 

## 2020-03-19 ENCOUNTER — Ambulatory Visit: Payer: 59 | Admitting: Family Medicine

## 2020-03-26 ENCOUNTER — Ambulatory Visit: Payer: 59 | Admitting: Family Medicine

## 2020-04-28 ENCOUNTER — Other Ambulatory Visit: Payer: Self-pay | Admitting: Family Medicine

## 2020-07-26 ENCOUNTER — Other Ambulatory Visit: Payer: Self-pay | Admitting: Family Medicine

## 2020-07-29 ENCOUNTER — Other Ambulatory Visit: Payer: Self-pay | Admitting: Family Medicine

## 2020-08-21 ENCOUNTER — Telehealth: Payer: Self-pay | Admitting: Family Medicine

## 2020-08-21 NOTE — Telephone Encounter (Signed)
Patient is calling and wanted to see if Dr. Elease Hashimoto received any paperwork from [facility], please advise. CB is [phone]

## 2020-08-21 NOTE — Telephone Encounter (Signed)
Informed patient paperwork has been received.  Will contact patient if additional information is needed.

## 2020-08-27 ENCOUNTER — Encounter: Payer: Self-pay | Admitting: Family Medicine

## 2020-08-27 ENCOUNTER — Other Ambulatory Visit: Payer: Self-pay

## 2020-08-27 ENCOUNTER — Ambulatory Visit (INDEPENDENT_AMBULATORY_CARE_PROVIDER_SITE_OTHER): Payer: 59 | Admitting: Family Medicine

## 2020-08-27 VITALS — BP 110/70 | HR 72 | Temp 98.6°F | Wt 236.9 lb

## 2020-08-27 DIAGNOSIS — E119 Type 2 diabetes mellitus without complications: Secondary | ICD-10-CM | POA: Diagnosis not present

## 2020-08-27 DIAGNOSIS — Z01818 Encounter for other preprocedural examination: Secondary | ICD-10-CM | POA: Diagnosis not present

## 2020-08-27 DIAGNOSIS — Z23 Encounter for immunization: Secondary | ICD-10-CM

## 2020-08-27 LAB — POCT GLYCOSYLATED HEMOGLOBIN (HGB A1C): Hemoglobin A1C: 7.2 % — AB (ref 4.0–5.6)

## 2020-08-27 NOTE — Progress Notes (Signed)
Established Patient Office Visit  Subjective:  Patient ID: John Frye, male    DOB: May 17, 1956  Age: 64 y.o. MRN: [id number]  CC:  Chief Complaint  Patient presents with  . Medical Clearance    HPI [name] presents for medical clearance for right hip replacement.  He has history of obstructive sleep apnea, ulcerative colitis, hypothyroidism, type 2 diabetes.  Diabetes has been reasonably well controlled.  Last A1c was 7.2%.  He has no known cardiac history.  His family history is unknown.  No history of smoking.  No chronic lung disease.  We had papers faxed over and he is requiring CBC, Chem-7, INR, and A1c prior to surgery.  We do not have any baseline EKGs.  He has been very limited in exercise because of the hip pains over the past couple years.  Past Medical History:  Diagnosis Date  . ALLERGIC RHINITIS 05/12/2008  . BACK PAIN 05/08/2008  . Dermatophytosis of nail 05/08/2008  . [place] DISEASE 05/08/2008  . HYPOTHYROIDISM 10/10/2009  . INSOMNIA 06/17/2010  . OBSTRUCTIVE SLEEP APNEA 05/08/2008  . PLANTAR FASCIITIS 05/08/2008  . PREDIABETES 09/18/2010  . SHOULDER PAIN, BILATERAL 10/10/2009    Past Surgical History:  Procedure Laterality Date  . CYST REMOVAL LEG     Right toe  . TONSILLECTOMY      Family History  Problem Relation Age of Onset  . Stroke Father 42    Social History   Socioeconomic History  . Marital status: Married    Spouse name: Not on file  . Number of children: Not on file  . Years of education: Not on file  . Highest education level: Not on file  Occupational History  . Not on file  Tobacco Use  . Smoking status: Never Smoker  . Smokeless tobacco: Never Used  Vaping Use  . Vaping Use: Never used  Substance and Sexual Activity  . Alcohol use: Yes    Alcohol/week: 4.0 standard drinks    Types: 2 Cans of beer, 2 Glasses of wine per week    Comment: Per week  . Drug use: Never  . Sexual activity: Not on file  Other Topics Concern  .  Not on file  Social History Narrative   Raised in a foster family   Social Determinants of Health   Financial Resource Strain:   . Difficulty of Paying Living Expenses: Not on file  Food Insecurity:   . Worried About Charity fundraiser in the Last Year: Not on file  . Ran Out of Food in the Last Year: Not on file  Transportation Needs:   . Lack of Transportation (Medical): Not on file  . Lack of Transportation (Non-Medical): Not on file  Physical Activity:   . Days of Exercise per Week: Not on file  . Minutes of Exercise per Session: Not on file  Stress:   . Feeling of Stress : Not on file  Social Connections:   . Frequency of Communication with Friends and Family: Not on file  . Frequency of Social Gatherings with Friends and Family: Not on file  . Attends Religious Services: Not on file  . Active Member of Clubs or Organizations: Not on file  . Attends Archivist Meetings: Not on file  . Marital Status: Not on file  Intimate Partner Violence:   . Fear of Current or Ex-Partner: Not on file  . Emotionally Abused: Not on file  . Physically Abused: Not on file  . Sexually  Abused: Not on file    Outpatient Medications Prior to Visit  Medication Sig Dispense Refill  . clonazePAM (KLONOPIN) 0.5 MG tablet Take 1 tablet (0.5 mg total) by mouth at bedtime as needed. 90 tablet 0  . cyclobenzaprine (FLEXERIL) 10 MG tablet Take 1 tablet (10 mg total) by mouth 3 (three) times daily as needed for muscle spasms. 60 tablet 0  . Etanercept (ENBREL) 25 MG/0.5ML SOLN Inject into the skin.    Marland Kitchen glucose blood (ONE TOUCH ULTRA TEST) test strip USE 1 STRIP 2 TIMES DAILY  AS NEEDED Dx Code E11.9 200 each 3  . HYDROcodone-acetaminophen (NORCO/VICODIN) 5-325 MG tablet Take 1 tablet by mouth every 6 (six) hours as needed. 15 tablet 0  . JARDIANCE 10 MG TABS tablet TAKE 1 TABLET DAILY 90 tablet 3  . levothyroxine (SYNTHROID) 112 MCG tablet TAKE 1 TABLET DAILY BEFORE BREAKFAST 90 tablet 2  .  mesalamine (LIALDA) 1.2 g EC tablet Take by mouth. Takes 2 tablets per day.    . metFORMIN (GLUCOPHAGE) 500 MG tablet TAKE 2 TABLETS BY MOUTH  TWICE DAILY WITH MEALS 360 tablet 3  . OneTouch Delica Lancets 33L MISC Use 1 lancet daily to check blood sugar level. Dx Code E11.9 100 each 3  . glimepiride (AMARYL) 2 MG tablet Take 1 tablet (2 mg total) by mouth daily before breakfast. 30 tablet 3   No facility-administered medications prior to visit.    No Known Allergies  ROS Review of Systems  Constitutional: Negative for fatigue.  Eyes: Negative for visual disturbance.  Respiratory: Negative for cough, chest tightness and shortness of breath.   Cardiovascular: Negative for chest pain, palpitations and leg swelling.  Neurological: Negative for dizziness, syncope, weakness, light-headedness and headaches.      Objective:    Physical Exam Constitutional:      Appearance: He is well-developed.  HENT:     Right Ear: External ear normal.     Left Ear: External ear normal.  Eyes:     Pupils: Pupils are equal, round, and reactive to light.  Neck:     Thyroid: No thyromegaly.  Cardiovascular:     Rate and Rhythm: Normal rate and regular rhythm.  Pulmonary:     Effort: Pulmonary effort is normal. No respiratory distress.     Breath sounds: Normal breath sounds. No wheezing or rales.  Musculoskeletal:     Cervical back: Neck supple.     Right lower leg: No edema.     Left lower leg: No edema.  Neurological:     Mental Status: He is alert and oriented to person, place, and time.     BP 110/70 (BP Location: Left Arm, Patient Position: Supine, Cuff Size: Large)   Pulse 72   Temp 98.6 F (37 C) (Oral)   Wt 236 lb 14.4 oz (107.5 kg)   SpO2 96%   BMI 31.26 kg/m  Wt Readings from Last 3 Encounters:  08/27/20 236 lb 14.4 oz (107.5 kg)  02/16/20 231 lb 11.2 oz (105.1 kg)  12/08/19 239 lb 3.2 oz (108.5 kg)     Health Maintenance Due  Topic Date Due  . HIV Screening  Never done   . FOOT EXAM  10/23/2017  . OPHTHALMOLOGY EXAM  11/30/2019  . URINE MICROALBUMIN  03/30/2020  . TETANUS/TDAP  04/25/2020  . INFLUENZA VACCINE  05/05/2020    There are no preventive care reminders to display for this patient.  Lab Results  Component Value Date   TSH  3.22 12/08/2019   Lab Results  Component Value Date   WBC 8.0 03/31/2019   HGB 14.8 03/31/2019   HCT 43.9 03/31/2019   MCV 88.6 03/31/2019   PLT 301.0 03/31/2019   Lab Results  Component Value Date   NA 139 03/31/2019   K 4.2 03/31/2019   CO2 29 03/31/2019   GLUCOSE 113 (H) 03/31/2019   BUN 13 03/31/2019   CREATININE 0.70 03/31/2019   BILITOT 0.6 03/31/2019   ALKPHOS 64 03/31/2019   AST 11 03/31/2019   ALT 15 03/31/2019   PROT 6.7 03/31/2019   ALBUMIN 4.6 03/31/2019   CALCIUM 9.6 03/31/2019   GFR 114.64 03/31/2019   Lab Results  Component Value Date   CHOL 175 03/31/2019   Lab Results  Component Value Date   HDL 51.70 03/31/2019   Lab Results  Component Value Date   LDLCALC 103 (H) 03/31/2019   Lab Results  Component Value Date   TRIG 100.0 03/31/2019   Lab Results  Component Value Date   CHOLHDL 3 03/31/2019   Lab Results  Component Value Date   HGBA1C 7.2 (A) 08/27/2020      Assessment & Plan:   Problem List Items Addressed This Visit      Unprioritized   Type 2 diabetes mellitus, controlled ([place]) - Primary   Relevant Orders   POCT glycosylated hemoglobin (Hb A1C) (Completed)    Other Visit Diagnoses    Pre-operative clearance       Relevant Orders   EKG 12-Lead   CBC with Differential/Platelet   Basic metabolic panel   Protime-INR    -EKG obtained-this shows normal sinus rhythm with no acute changes  -We will await labs below and send the papers to surgeon.  No known contraindications  -Check further labs as above with CBC, basic metabolic panel, INR.  His A1c today 7.2%  -We discussed goal A1c less than 7.  He would like to give a few months of lifestyle  modification and further weight loss after his surgery before considering any additional medications  No orders of the defined types were placed in this encounter.   Follow-up: No follow-ups on file.    Carolann Littler, MD

## 2020-08-30 LAB — CBC WITH DIFFERENTIAL/PLATELET

## 2020-08-30 LAB — BASIC METABOLIC PANEL
BUN: 15 mg/dL (ref 7–25)
CO2: 30 mmol/L (ref 20–32)
Calcium: 9.5 mg/dL (ref 8.6–10.3)
Chloride: 102 mmol/L (ref 98–110)
Creat: 0.78 mg/dL (ref 0.70–1.25)
Glucose, Bld: 114 mg/dL — ABNORMAL HIGH (ref 65–99)
Potassium: 4.3 mmol/L (ref 3.5–5.3)
Sodium: 139 mmol/L (ref 135–146)

## 2020-08-30 LAB — PROTIME-INR
INR: 1.1
Prothrombin Time: 11.4 s (ref 9.0–11.5)

## 2020-09-03 NOTE — Progress Notes (Signed)
Lavender tube was not collected for a CBC.  Please advise next step.

## 2020-09-11 ENCOUNTER — Other Ambulatory Visit: Payer: Self-pay | Admitting: Family Medicine

## 2020-09-11 ENCOUNTER — Other Ambulatory Visit: Payer: Self-pay

## 2020-09-11 ENCOUNTER — Other Ambulatory Visit: Payer: 59

## 2020-09-11 ENCOUNTER — Telehealth: Payer: Self-pay | Admitting: Family Medicine

## 2020-09-11 DIAGNOSIS — Z01818 Encounter for other preprocedural examination: Secondary | ICD-10-CM

## 2020-09-11 LAB — CBC WITH DIFFERENTIAL/PLATELET
Absolute Monocytes: 518 cells/uL (ref 200–950)
Basophils Absolute: 43 cells/uL (ref 0–200)
Basophils Relative: 0.6 %
Eosinophils Absolute: 71 cells/uL (ref 15–500)
Eosinophils Relative: 1 %
HCT: 45 % (ref 38.5–50.0)
Hemoglobin: 15.2 g/dL (ref 13.2–17.1)
Lymphs Abs: 2279 cells/uL (ref 850–3900)
MCH: 30.6 pg (ref 27.0–33.0)
MCHC: 33.8 g/dL (ref 32.0–36.0)
MCV: 90.7 fL (ref 80.0–100.0)
MPV: 10.3 fL (ref 7.5–12.5)
Monocytes Relative: 7.3 %
Neutro Abs: 4189 cells/uL (ref 1500–7800)
Neutrophils Relative %: 59 %
Platelets: 319 10*3/uL (ref 140–400)
RBC: 4.96 10*6/uL (ref 4.20–5.80)
RDW: 12.8 % (ref 11.0–15.0)
Total Lymphocyte: 32.1 %
WBC: 7.1 10*3/uL (ref 3.8–10.8)

## 2020-09-11 NOTE — Progress Notes (Signed)
Per PCP ok to have pt come in for CBC STAT/lab created/thx dmf

## 2020-09-11 NOTE — Telephone Encounter (Signed)
Wells Guiles is calling and stated that patient is scheduled for surgery for tomorrow and needs a cbc done prior to surgery. Caller wanted to see if provider could put orders in for patient to have labs completed today, please advise. CB is [phone]

## 2020-09-11 NOTE — Telephone Encounter (Signed)
Patient is scheduled to come in for labs today @ 1 pm.

## 2021-01-16 LAB — HM DIABETES EYE EXAM

## 2021-01-23 ENCOUNTER — Other Ambulatory Visit: Payer: Self-pay | Admitting: Family Medicine

## 2021-01-23 NOTE — Telephone Encounter (Signed)
Last TSH labs- 12/08/2019 Last office visit- 08/27/20   No future appointment scheduled Can this patient receive a refill

## 2021-01-24 NOTE — Telephone Encounter (Signed)
Appointment has been scheduled for 02/17/2021 at 3:15pm

## 2021-01-24 NOTE — Telephone Encounter (Signed)
Message was left for patient to call and  schedule follow up for any future refills

## 2021-02-17 ENCOUNTER — Other Ambulatory Visit: Payer: Self-pay

## 2021-02-17 ENCOUNTER — Ambulatory Visit (INDEPENDENT_AMBULATORY_CARE_PROVIDER_SITE_OTHER): Payer: 59 | Admitting: Family Medicine

## 2021-02-17 ENCOUNTER — Encounter: Payer: Self-pay | Admitting: Family Medicine

## 2021-02-17 VITALS — BP 120/60 | HR 75 | Temp 98.5°F | Wt 235.1 lb

## 2021-02-17 DIAGNOSIS — Z125 Encounter for screening for malignant neoplasm of prostate: Secondary | ICD-10-CM

## 2021-02-17 DIAGNOSIS — E119 Type 2 diabetes mellitus without complications: Secondary | ICD-10-CM

## 2021-02-17 DIAGNOSIS — E038 Other specified hypothyroidism: Secondary | ICD-10-CM

## 2021-02-17 LAB — POCT GLYCOSYLATED HEMOGLOBIN (HGB A1C): Hemoglobin A1C: 7.5 % — AB (ref 4.0–5.6)

## 2021-02-17 NOTE — Progress Notes (Signed)
Established Patient Office Visit  Subjective:  Patient ID: John Frye, male    DOB: [birth year]  Age: 65 y.o. MRN: [id number]  CC:  Chief Complaint  Patient presents with  . lab work    Wants PSA and TSH checked.     HPI John Frye presents for medical follow-up.  He has history of obstructive sleep apnea, ulcerative colitis, hypothyroidism, type 2 diabetes.  He is due for follow-up TSH.  He is on levothyroxine 112 mcg daily and compliant with therapy.  Type 2 diabetes.  Last A1c 7.2%.  He is on metformin, Jardiance, and low-dose glimepiride.  Frequently gets fasting sugars around 130-150 and sometimes postprandial sugars around 190.  No significant polyuria or polydipsia.  He had recent eye checkup.  No recent urine microalbumin screen.  Blood pressures have consistently been stable.  He has history of mild hyperlipidemia.  Currently not on statin.  He is not reluctant to consider statin if indicated by labs and 10-year risk of CAD.  Past Medical History:  Diagnosis Date  . ALLERGIC RHINITIS 05/12/2008  . BACK PAIN 05/08/2008  . Dermatophytosis of nail 05/08/2008  . [place] DISEASE 05/08/2008  . HYPOTHYROIDISM 10/10/2009  . INSOMNIA 06/17/2010  . OBSTRUCTIVE SLEEP APNEA 05/08/2008  . PLANTAR FASCIITIS 05/08/2008  . PREDIABETES 09/18/2010  . SHOULDER PAIN, BILATERAL 10/10/2009    Past Surgical History:  Procedure Laterality Date  . CYST REMOVAL LEG     Right toe  . TONSILLECTOMY      Family History  Problem Relation Age of Onset  . Stroke Father 82    Social History   Socioeconomic History  . Marital status: Married    Spouse name: Not on file  . Number of children: Not on file  . Years of education: Not on file  . Highest education level: Not on file  Occupational History  . Not on file  Tobacco Use  . Smoking status: Never Smoker  . Smokeless tobacco: Never Used  Vaping Use  . Vaping Use: Never used  Substance and Sexual Activity  . Alcohol use: Yes     Alcohol/week: 4.0 standard drinks    Types: 2 Cans of beer, 2 Glasses of wine per week    Comment: Per week  . Drug use: Never  . Sexual activity: Not on file  Other Topics Concern  . Not on file  Social History Narrative   Raised in a foster family   Social Determinants of Health   Financial Resource Strain: Not on file  Food Insecurity: Not on file  Transportation Needs: Not on file  Physical Activity: Not on file  Stress: Not on file  Social Connections: Not on file  Intimate Partner Violence: Not on file    Outpatient Medications Prior to Visit  Medication Sig Dispense Refill  . clonazePAM (KLONOPIN) 0.5 MG tablet Take 1 tablet (0.5 mg total) by mouth at bedtime as needed. 90 tablet 0  . cyclobenzaprine (FLEXERIL) 10 MG tablet Take 1 tablet (10 mg total) by mouth 3 (three) times daily as needed for muscle spasms. 60 tablet 0  . Etanercept (ENBREL) 25 MG/0.5ML SOLN Inject into the skin.    Marland Kitchen glimepiride (AMARYL) 2 MG tablet Take 1 tablet (2 mg total) by mouth daily before breakfast. 30 tablet 3  . glucose blood (ONE TOUCH ULTRA TEST) test strip USE 1 STRIP 2 TIMES DAILY  AS NEEDED Dx Code E11.9 200 each 3  . HYDROcodone-acetaminophen (NORCO/VICODIN) 5-325 MG tablet Take 1  tablet by mouth every 6 (six) hours as needed. 15 tablet 0  . JARDIANCE 10 MG TABS tablet TAKE 1 TABLET DAILY 90 tablet 3  . levothyroxine (SYNTHROID) 112 MCG tablet TAKE 1 TABLET DAILY BEFORE BREAKFAST 90 tablet 0  . mesalamine (LIALDA) 1.2 g EC tablet Take by mouth. Takes 2 tablets per day.    . metFORMIN (GLUCOPHAGE) 500 MG tablet TAKE 2 TABLETS BY MOUTH  TWICE DAILY WITH MEALS 360 tablet 3  . OneTouch Delica Lancets 32T MISC Use 1 lancet daily to check blood sugar level. Dx Code E11.9 100 each 3   No facility-administered medications prior to visit.    No Known Allergies  ROS Review of Systems  Constitutional: Negative for fatigue.  Eyes: Negative for visual disturbance.  Respiratory: Negative for  cough, chest tightness and shortness of breath.   Cardiovascular: Negative for chest pain, palpitations and leg swelling.  Endocrine: Negative for polydipsia and polyuria.  Neurological: Negative for dizziness, syncope, weakness, light-headedness and headaches.      Objective:    Physical Exam Constitutional:      Appearance: He is well-developed.  HENT:     Right Ear: External ear normal.     Left Ear: External ear normal.  Eyes:     Pupils: Pupils are equal, round, and reactive to light.  Neck:     Thyroid: No thyromegaly.  Cardiovascular:     Rate and Rhythm: Normal rate and regular rhythm.  Pulmonary:     Effort: Pulmonary effort is normal. No respiratory distress.     Breath sounds: Normal breath sounds. No wheezing or rales.  Musculoskeletal:     Cervical back: Neck supple.     Right lower leg: No edema.     Left lower leg: No edema.  Skin:    Comments: Normal sensory function with monofilament throughout.  No significant callus.  He has small crusted area dorsal aspect right great toe where he has had what sounds like digital mucous cyst in the past.  He has seen podiatrist for this in the past.  No signs of secondary infection.  Neurological:     Mental Status: He is alert and oriented to person, place, and time.     BP 120/60 (BP Location: Left Arm, Patient Position: Sitting, Cuff Size: Normal)   Pulse 75   Temp 98.5 F (36.9 C) (Oral)   Wt 235 lb 1.6 oz (106.6 kg)   SpO2 96%   BMI 31.02 kg/m  Wt Readings from Last 3 Encounters:  02/17/21 235 lb 1.6 oz (106.6 kg)  08/27/20 236 lb 14.4 oz (107.5 kg)  02/16/20 231 lb 11.2 oz (105.1 kg)     Health Maintenance Due  Topic Date Due  . HIV Screening  Never done  . FOOT EXAM  10/23/2017  . URINE MICROALBUMIN  03/30/2020  . TETANUS/TDAP  04/25/2020    There are no preventive care reminders to display for this patient.  Lab Results  Component Value Date   TSH 3.22 12/08/2019   Lab Results  Component  Value Date   WBC 7.1 09/11/2020   HGB 15.2 09/11/2020   HCT 45.0 09/11/2020   MCV 90.7 09/11/2020   PLT 319 09/11/2020   Lab Results  Component Value Date   NA 139 08/27/2020   K 4.3 08/27/2020   CO2 30 08/27/2020   GLUCOSE 114 (H) 08/27/2020   BUN 15 08/27/2020   CREATININE 0.78 08/27/2020   BILITOT 0.6 03/31/2019   ALKPHOS 64  03/31/2019   AST 11 03/31/2019   ALT 15 03/31/2019   PROT 6.7 03/31/2019   ALBUMIN 4.6 03/31/2019   CALCIUM 9.5 08/27/2020   GFR 114.64 03/31/2019   Lab Results  Component Value Date   CHOL 175 03/31/2019   Lab Results  Component Value Date   HDL 51.70 03/31/2019   Lab Results  Component Value Date   LDLCALC 103 (H) 03/31/2019   Lab Results  Component Value Date   TRIG 100.0 03/31/2019   Lab Results  Component Value Date   CHOLHDL 3 03/31/2019   Lab Results  Component Value Date   HGBA1C 7.5 (A) 02/17/2021      Assessment & Plan:   #1 type 2 diabetes.  History of reasonably good control but not quite to goal by most recent A1c.  Would like to see A1c less than 7%. -Recheck A1c.  Consider either further titration of Jardiance or possible GLP-1 if still elevated -Check urine microalbumin screen -Continue with yearly eye exams  #2 hypothyroidism -Recheck TSH today  #3 health maintenance.  Patient requesting PSA screening.  Previous levels have been extremely low -Recheck PSA  #4 history of mild hyperlipidemia.  Recheck fasting lipids today.  Calculate 10-year risk of CAD and recommend statin if indicated   No orders of the defined types were placed in this encounter.   Follow-up: Return in about 6 months (around 08/20/2021).    Carolann Littler, MD

## 2021-02-18 LAB — HEPATIC FUNCTION PANEL
ALT: 12 U/L (ref 0–53)
AST: 11 U/L (ref 0–37)
Albumin: 4.8 g/dL (ref 3.5–5.2)
Alkaline Phosphatase: 89 U/L (ref 39–117)
Bilirubin, Direct: 0.1 mg/dL (ref 0.0–0.3)
Total Bilirubin: 0.6 mg/dL (ref 0.2–1.2)
Total Protein: 6.9 g/dL (ref 6.0–8.3)

## 2021-02-18 LAB — LIPID PANEL
Cholesterol: 168 mg/dL (ref 0–200)
HDL: 55.1 mg/dL (ref >39.00)
LDL Cholesterol: 91 mg/dL (ref 0–99)
NonHDL: 113.25
Total CHOL/HDL Ratio: 3
Triglycerides: 113 mg/dL (ref 0.0–149.0)
VLDL: 22.6 mg/dL (ref 0.0–40.0)

## 2021-02-18 LAB — BASIC METABOLIC PANEL
BUN: 24 mg/dL — ABNORMAL HIGH (ref 6–23)
CO2: 31 mEq/L (ref 19–32)
Calcium: 9.6 mg/dL (ref 8.4–10.5)
Chloride: 98 mEq/L (ref 96–112)
Creatinine, Ser: 0.82 mg/dL (ref 0.40–1.50)
GFR: 93.88 mL/min (ref >60.00)
Glucose, Bld: 146 mg/dL — ABNORMAL HIGH (ref 70–99)
Potassium: 4.2 mEq/L (ref 3.5–5.1)
Sodium: 136 mEq/L (ref 135–145)

## 2021-02-18 LAB — PSA: PSA: 0.47 ng/mL (ref 0.10–4.00)

## 2021-02-18 LAB — MICROALBUMIN / CREATININE URINE RATIO
Creatinine,U: 46.6 mg/dL
Microalb Creat Ratio: 1.5 mg/g (ref 0.0–30.0)
Microalb, Ur: <0.7 mg/dL (ref 0.0–1.9)

## 2021-02-18 LAB — TSH: TSH: 3.1 u[IU]/mL (ref 0.35–4.50)

## 2021-04-23 ENCOUNTER — Other Ambulatory Visit: Payer: Self-pay | Admitting: Family Medicine

## 2021-07-22 ENCOUNTER — Other Ambulatory Visit: Payer: Self-pay | Admitting: Family Medicine

## 2022-02-20 ENCOUNTER — Encounter: Payer: 59 | Admitting: Family Medicine

## 2022-02-27 ENCOUNTER — Encounter: Payer: 59 | Admitting: Family Medicine

## 2022-03-20 ENCOUNTER — Ambulatory Visit (INDEPENDENT_AMBULATORY_CARE_PROVIDER_SITE_OTHER): Payer: 59 | Admitting: Family Medicine

## 2022-03-20 ENCOUNTER — Encounter: Payer: Self-pay | Admitting: Family Medicine

## 2022-03-20 VITALS — BP 104/60 | HR 70 | Temp 98.1°F | Ht 74.41 in | Wt 242.2 lb

## 2022-03-20 DIAGNOSIS — E119 Type 2 diabetes mellitus without complications: Secondary | ICD-10-CM | POA: Diagnosis not present

## 2022-03-20 DIAGNOSIS — Z0000 Encounter for general adult medical examination without abnormal findings: Secondary | ICD-10-CM

## 2022-03-20 LAB — LIPID PANEL
Cholesterol: 187 mg/dL (ref 0–200)
HDL: 57.4 mg/dL (ref >39.00)
LDL Cholesterol: 117 mg/dL — ABNORMAL HIGH (ref 0–99)
NonHDL: 129.89
Total CHOL/HDL Ratio: 3
Triglycerides: 66 mg/dL (ref 0.0–149.0)
VLDL: 13.2 mg/dL (ref 0.0–40.0)

## 2022-03-20 LAB — CBC WITH DIFFERENTIAL/PLATELET
Basophils Absolute: 0.1 10*3/uL (ref 0.0–0.1)
Basophils Relative: 0.8 % (ref 0.0–3.0)
Eosinophils Absolute: 0.2 10*3/uL (ref 0.0–0.7)
Eosinophils Relative: 2.4 % (ref 0.0–5.0)
HCT: 45.8 % (ref 39.0–52.0)
Hemoglobin: 15.3 g/dL (ref 13.0–17.0)
Lymphocytes Relative: 39.3 % (ref 12.0–46.0)
Lymphs Abs: 3.1 10*3/uL (ref 0.7–4.0)
MCHC: 33.4 g/dL (ref 30.0–36.0)
MCV: 89.1 fl (ref 78.0–100.0)
Monocytes Absolute: 0.7 10*3/uL (ref 0.1–1.0)
Monocytes Relative: 8.7 % (ref 3.0–12.0)
Neutro Abs: 3.8 10*3/uL (ref 1.4–7.7)
Neutrophils Relative %: 48.8 % (ref 43.0–77.0)
Platelets: 295 10*3/uL (ref 150.0–400.0)
RBC: 5.15 Mil/uL (ref 4.22–5.81)
RDW: 13.4 % (ref 11.5–15.5)
WBC: 7.8 10*3/uL (ref 4.0–10.5)

## 2022-03-20 LAB — POCT GLYCOSYLATED HEMOGLOBIN (HGB A1C): Hemoglobin A1C: 7.9 % — AB (ref 4.0–5.6)

## 2022-03-20 LAB — BASIC METABOLIC PANEL
BUN: 17 mg/dL (ref 6–23)
CO2: 28 mEq/L (ref 19–32)
Calcium: 9.5 mg/dL (ref 8.4–10.5)
Chloride: 100 mEq/L (ref 96–112)
Creatinine, Ser: 0.82 mg/dL (ref 0.40–1.50)
GFR: 93.17 mL/min (ref >60.00)
Glucose, Bld: 157 mg/dL — ABNORMAL HIGH (ref 70–99)
Potassium: 4.4 mEq/L (ref 3.5–5.1)
Sodium: 138 mEq/L (ref 135–145)

## 2022-03-20 LAB — HEPATIC FUNCTION PANEL
ALT: 15 U/L (ref 0–53)
AST: 12 U/L (ref 0–37)
Albumin: 4.6 g/dL (ref 3.5–5.2)
Alkaline Phosphatase: 77 U/L (ref 39–117)
Bilirubin, Direct: 0.1 mg/dL (ref 0.0–0.3)
Total Bilirubin: 0.7 mg/dL (ref 0.2–1.2)
Total Protein: 7 g/dL (ref 6.0–8.3)

## 2022-03-20 LAB — MICROALBUMIN / CREATININE URINE RATIO
Creatinine,U: 42.2 mg/dL
Microalb Creat Ratio: 1.7 mg/g (ref 0.0–30.0)
Microalb, Ur: <0.7 mg/dL (ref 0.0–1.9)

## 2022-03-20 LAB — PSA: PSA: 0.23 ng/mL (ref 0.10–4.00)

## 2022-03-20 LAB — TSH: TSH: 4.47 u[IU]/mL (ref 0.35–5.50)

## 2022-03-20 NOTE — Progress Notes (Signed)
Established Patient Office Visit  Subjective   Patient ID: John Frye, male    DOB: Jun 12, 1956  Age: 66 y.o. MRN: [id number]  Chief Complaint  Patient presents with   Annual Exam    HPI   John Frye is seen for exam for physical exam.  He has history of ulcerative colitis, type 2 diabetes, hypothyroidism, osteoarthritis.  Followed by GI specialist over in [place].  His last colonoscopy was 6/22.  He currently is on immunosuppressant injection every 8 weeks.  Those symptoms are stable.  Due for follow-up labs including A1c.  Health maintenance reviewed  -Due for eye exam and this has been set up -Prior hepatitis C screen negative -Shingles vaccine complete -Colonoscopy 6/22.  Getting every 3-year follow-up  Family history-he lives with foster parents from age 77 months on.  He was aware of his father having alcoholism.  He had a sister that died in her 66s of some type of cancer but is not sure which type.  Father had history of stroke.  Mother died in her 41s.  No known medical problems.  Social history-he is married.  Non-smoker.  Looking at possible retirement in 1 year.  Past Medical History:  Diagnosis Date   ALLERGIC RHINITIS 05/12/2008   BACK PAIN 05/08/2008   Dermatophytosis of nail 05/08/2008   [place] DISEASE 05/08/2008   HYPOTHYROIDISM 10/10/2009   INSOMNIA 06/17/2010   OBSTRUCTIVE SLEEP APNEA 05/08/2008   PLANTAR FASCIITIS 05/08/2008   PREDIABETES 09/18/2010   SHOULDER PAIN, BILATERAL 10/10/2009   Past Surgical History:  Procedure Laterality Date   CYST REMOVAL LEG     Right toe   TONSILLECTOMY      reports that he has never smoked. He has never used smokeless tobacco. He reports current alcohol use of about 4.0 standard drinks of alcohol per week. He reports that he does not use drugs. family history includes Alcohol abuse in his father; Stroke (age of onset: 44) in his father. No Known Allergies  Review of Systems  Constitutional:  Negative for chills, fever,  malaise/fatigue and weight loss.  HENT:  Negative for hearing loss.   Eyes:  Negative for blurred vision and double vision.  Respiratory:  Negative for cough and shortness of breath.   Cardiovascular:  Negative for chest pain, palpitations and leg swelling.  Gastrointestinal:  Negative for abdominal pain, blood in stool, constipation and diarrhea.  Genitourinary:  Negative for dysuria.  Skin:  Negative for rash.  Neurological:  Negative for dizziness, speech change, seizures, loss of consciousness and headaches.  Psychiatric/Behavioral:  Negative for depression.       Objective:     BP 104/60 (BP Location: Left Arm, Patient Position: Sitting, Cuff Size: Normal)   Pulse 70   Temp 98.1 F (36.7 C) (Oral)   Ht 6' 2.41" (1.89 m)   Wt 242 lb 3.2 oz (109.9 kg)   SpO2 98%   BMI 30.76 kg/m    Physical Exam Constitutional:      General: He is not in acute distress.    Appearance: He is well-developed.  HENT:     Head: Normocephalic and atraumatic.     Right Ear: External ear normal.     Left Ear: External ear normal.  Eyes:     Conjunctiva/sclera: Conjunctivae normal.     Pupils: Pupils are equal, round, and reactive to light.  Neck:     Thyroid: No thyromegaly.  Cardiovascular:     Rate and Rhythm: Normal rate and regular rhythm.  Heart sounds: Normal heart sounds. No murmur heard. Pulmonary:     Effort: No respiratory distress.     Breath sounds: No wheezing or rales.  Abdominal:     General: Bowel sounds are normal. There is no distension.     Palpations: Abdomen is soft. There is no mass.     Tenderness: There is no abdominal tenderness. There is no guarding or rebound.  Musculoskeletal:     Cervical back: Normal range of motion and neck supple.  Lymphadenopathy:     Cervical: No cervical adenopathy.  Skin:    Findings: No rash.  Neurological:     Mental Status: He is alert and oriented to person, place, and time.     Cranial Nerves: No cranial nerve deficit.       Results for orders placed or performed in visit on 03/20/22  POCT glycosylated hemoglobin (Hb A1C)  Result Value Ref Range   Hemoglobin A1C 7.9 (A) 4.0 - 5.6 %   HbA1c POC (<> result, manual entry)     HbA1c, POC (prediabetic range)     HbA1c, POC (controlled diabetic range)        The 10-year ASCVD risk score (Arnett DK, et al., 2019) is: 11.9%    Assessment & Plan:   Problem List Items Addressed This Visit       Unprioritized   Type 2 diabetes mellitus, controlled ([place])   Relevant Orders   Microalbumin / creatinine urine ratio   POCT glycosylated hemoglobin (Hb A1C) (Completed)   Other Visit Diagnoses     Physical exam    -  Primary   Relevant Orders   Basic metabolic panel   Lipid panel   CBC with Differential/Platelet   TSH   Hepatic function panel   PSA     -Recommend consider annual flu vaccine -Check labs above including urine microalbumin screen -We did discuss the fact that if his A1c remains up consider GLP-1 medication.  He may be leaning more toward lifestyle and diet modification for a few months first. -Set up diabetic eye exam -Continue close follow-up with GI regarding his ulcerative colitis  No follow-ups on file.    Carolann Littler, MD

## 2022-04-20 ENCOUNTER — Other Ambulatory Visit: Payer: Self-pay | Admitting: Family Medicine

## 2022-07-13 LAB — HM DIABETES EYE EXAM

## 2022-07-17 ENCOUNTER — Other Ambulatory Visit: Payer: Self-pay | Admitting: Family Medicine

## 2022-07-27 ENCOUNTER — Encounter: Payer: Self-pay | Admitting: Family Medicine

## 2022-08-24 ENCOUNTER — Ambulatory Visit (INDEPENDENT_AMBULATORY_CARE_PROVIDER_SITE_OTHER): Payer: 59 | Admitting: Family Medicine

## 2022-08-24 ENCOUNTER — Encounter: Payer: Self-pay | Admitting: Family Medicine

## 2022-08-24 VITALS — BP 110/70 | HR 78 | Temp 98.8°F | Wt 240.5 lb

## 2022-08-24 DIAGNOSIS — Z23 Encounter for immunization: Secondary | ICD-10-CM | POA: Diagnosis not present

## 2022-08-24 DIAGNOSIS — E119 Type 2 diabetes mellitus without complications: Secondary | ICD-10-CM | POA: Diagnosis not present

## 2022-08-24 DIAGNOSIS — R051 Acute cough: Secondary | ICD-10-CM | POA: Diagnosis not present

## 2022-08-24 DIAGNOSIS — M67472 Ganglion, left ankle and foot: Secondary | ICD-10-CM | POA: Diagnosis not present

## 2022-08-24 LAB — POCT GLYCOSYLATED HEMOGLOBIN (HGB A1C): Hemoglobin A1C: 7.1 % — AB (ref 4.0–5.6)

## 2022-08-24 NOTE — Patient Instructions (Addendum)
I will set up referral to podiatrist for the toe  A1C has improved to 7.1%.   let's plan on 6 month follow up.

## 2022-08-24 NOTE — Progress Notes (Signed)
Established Patient Office Visit  Subjective   Patient ID: John Frye, male    DOB: [birth year]  Age: 66 y.o. MRN: [id number]  Chief Complaint  Patient presents with   Cyst    Left big toe   chest congestion   Diabetes    HPI   Seen for the following issues  Type 2 diabetes.  Currently treated with Jardiance and metformin.  He has tighten up diet sounds since last visit.  His last A1c was 7.9%.  Down to 7.1% today.  He prefers to see if he get this down below 7% with lifestyle changes alone without additional medication  Recurrent fluid-filled lesion dorsum left great toe.  He states he has taken a needle and drained fluid out of this several times which is more of a clear viscous type fluid.  He states he had this excised once by Dr. Cassell Smiles years ago and has recurred in the same location.  Sometimes has slight discomfort but mostly nonpainful.  He does have type 2 diabetes.  Chest congestion.  He thinks irritated by getting up leaves and dry conditions.  He thinks he may have a little bit of wheezing intermittently.  No fever.  No productive cough.  No sinusitis symptoms.  Past Medical History:  Diagnosis Date   ALLERGIC RHINITIS 05/12/2008   BACK PAIN 05/08/2008   Dermatophytosis of nail 05/08/2008   GRAVES DISEASE 05/08/2008   HYPOTHYROIDISM 10/10/2009   INSOMNIA 06/17/2010   OBSTRUCTIVE SLEEP APNEA 05/08/2008   PLANTAR FASCIITIS 05/08/2008   PREDIABETES 09/18/2010   SHOULDER PAIN, BILATERAL 10/10/2009   Past Surgical History:  Procedure Laterality Date   CYST REMOVAL LEG     Right toe   TONSILLECTOMY      reports that he has never smoked. He has never used smokeless tobacco. He reports current alcohol use of about 4.0 standard drinks of alcohol per week. He reports that he does not use drugs. family history includes Alcohol abuse in his father; Stroke (age of onset: 29) in his father. No Known Allergies  Review of Systems  Constitutional:  Negative for chills and fever.   Respiratory:  Positive for cough. Negative for hemoptysis and shortness of breath.       Objective:     BP 110/70 (BP Location: Left Arm, Patient Position: Sitting, Cuff Size: Large)   Pulse 78   Temp 98.8 F (37.1 C) (Oral)   Wt 240 lb 8 oz (109.1 kg)   SpO2 98%   BMI 30.54 kg/m    Physical Exam Vitals reviewed.  Constitutional:      Appearance: Normal appearance.  Cardiovascular:     Rate and Rhythm: Normal rate and regular rhythm.  Pulmonary:     Effort: Pulmonary effort is normal.     Breath sounds: Normal breath sounds. No rales.  Skin:    Comments: Left great toe reveals small scabbed area approximately 10 x 10 mm.  This he states was a fluid-filled cystic area until this was drained by needle few days ago.  No signs of secondary infection.  No cellulitis changes.  Neurological:     Mental Status: He is alert.      Results for orders placed or performed in visit on 08/24/22  POCT glycosylated hemoglobin (Hb A1C)  Result Value Ref Range   Hemoglobin A1C 7.1 (A) 4.0 - 5.6 %   HbA1c POC (<> result, manual entry)     HbA1c, POC (prediabetic range)     HbA1c, POC (  controlled diabetic range)        The 10-year ASCVD risk score (Arnett DK, et al., 2019) is: 14.9%    Assessment & Plan:   #1 question digital mucous cyst left great toe.  Cannot be diagnostically sure on exam today as this was recently ruptured.  This has been recurrent in the same location with clear fluid.  Set up podiatry referral to further assess-referral placed  #2 type 2 diabetes.  Improving with A1c today 7.1%.  We did discuss GLP-1 medications but he would like to give this at least a few more months of lifestyle modification to see if we get below 7  #3 cough.  Suspect probably allergic irritation.  Did discuss possible trial of prednisone but with his diabetes and relatively normal lung exam today decided against this.  Sounds like his symptoms were getting better compared to several days  ago.  Observe for now.   Return in about 6 months (around 02/22/2023).    Carolann Littler, MD

## 2022-08-25 ENCOUNTER — Encounter: Payer: Self-pay | Admitting: Family Medicine

## 2022-08-25 MED ORDER — PREDNISONE 20 MG PO TABS: ORAL_TABLET | ORAL | 0 refills | Status: DC

## 2022-08-26 ENCOUNTER — Ambulatory Visit (INDEPENDENT_AMBULATORY_CARE_PROVIDER_SITE_OTHER): Payer: 59 | Admitting: Podiatry

## 2022-08-26 DIAGNOSIS — M6740 Ganglion, unspecified site: Secondary | ICD-10-CM | POA: Diagnosis not present

## 2022-08-26 NOTE — Progress Notes (Signed)
Subjective:  Patient ID: John Frye, male    DOB: 15-Jul-1956,  MRN: [id number]  Chief Complaint  Patient presents with   Ganglion Cyst    Patient came in today with ganglion cyst that pops up on the left hallux, patient states that he pops the cyst and it comes back in 3 weeks     66 y.o. male presents with the above complaint.  Patient presents with left mucoid cyst/ganglion cyst.  Patient states he had it surgically removed a few years ago when he kept coming back.  He has been popping and draining it himself.  Hurts with ambulation.  He wanted to get it evaluated see if there is any other options that are not surgical excision or arthrodesis.  He denies any other acute complaints he has not seen anyone else prior to seeing me in quite some time.   Review of Systems: Negative except as noted in the HPI. Denies N/V/F/Ch.  Past Medical History:  Diagnosis Date   ALLERGIC RHINITIS 05/12/2008   BACK PAIN 05/08/2008   Dermatophytosis of nail 05/08/2008   GRAVES DISEASE 05/08/2008   HYPOTHYROIDISM 10/10/2009   INSOMNIA 06/17/2010   OBSTRUCTIVE SLEEP APNEA 05/08/2008   PLANTAR FASCIITIS 05/08/2008   PREDIABETES 09/18/2010   SHOULDER PAIN, BILATERAL 10/10/2009    Current Outpatient Medications:    clonazePAM (KLONOPIN) 0.5 MG tablet, Take 1 tablet (0.5 mg total) by mouth at bedtime as needed., Disp: 90 tablet, Rfl: 0   cyclobenzaprine (FLEXERIL) 10 MG tablet, Take 1 tablet (10 mg total) by mouth 3 (three) times daily as needed for muscle spasms., Disp: 60 tablet, Rfl: 0   empagliflozin (JARDIANCE) 10 MG TABS tablet, TAKE 1 TABLET DAILY, Disp: 90 tablet, Rfl: 0   glucose blood (ONE TOUCH ULTRA TEST) test strip, USE 1 STRIP 2 TIMES DAILY  AS NEEDED Dx Code E11.9, Disp: 200 each, Rfl: 3   levothyroxine (SYNTHROID) 112 MCG tablet, TAKE 1 TABLET DAILY BEFORE BREAKFAST, Disp: 90 tablet, Rfl: 0   metFORMIN (GLUCOPHAGE) 500 MG tablet, TAKE 2 TABLETS BY MOUTH  TWICE DAILY WITH MEALS, Disp: 360 tablet, Rfl:  0   OneTouch Delica Lancets 02D MISC, Use 1 lancet daily to check blood sugar level. Dx Code E11.9, Disp: 100 each, Rfl: 3   predniSONE (DELTASONE) 20 MG tablet, Take 2 tablets by mouth once daily for 5 days, Disp: 10 tablet, Rfl: 0   vedolizumab (ENTYVIO) 300 MG injection, Inject 300 mg into the vein every 8 (eight) weeks., Disp: , Rfl:   Social History   Tobacco Use  Smoking Status Never  Smokeless Tobacco Never    No Known Allergies Objective:  There were no vitals filed for this visit. There is no height or weight on file to calculate BMI. Constitutional Well developed. Well nourished.  Vascular Dorsalis pedis pulses palpable bilaterally. Posterior tibial pulses palpable bilaterally. Capillary refill normal to all digits.  No cyanosis or clubbing noted. Pedal hair growth normal.  Neurologic Normal speech. Oriented to person, place, and time. Epicritic sensation to light touch grossly present bilaterally.  Dermatologic Dried mucoid cyst noted to the left hallux IPJ appears to be coming from underlying joint.  No limited range of motion noted at the IPJ no arthritis noted.  No crepitus clinically appreciated.  No recurrence mucoid cyst noted at this time  Orthopedic: Normal joint ROM without pain or crepitus bilaterally. No visible deformities. No bony tenderness.   Radiographs: None Assessment:   1. Mucoid cyst of joint  Plan:  Patient was evaluated and treated and all questions answered.  Left hallux IPJ mucoid cyst now dried -All questions and concerns were discussed with the patient in extensive detail -I discussed with the patient on surgical excision of the cyst as well as a possible arthrodesis of the IPJ joint for more definitive treatment.  However patient would like to think about all the options and will get back to me when he is ready.  No follow-ups on file.

## 2022-10-15 ENCOUNTER — Other Ambulatory Visit: Payer: Self-pay | Admitting: Family Medicine

## 2022-11-11 ENCOUNTER — Telehealth: Payer: Self-pay

## 2022-11-11 NOTE — Telephone Encounter (Signed)
Left a message for the patient to return my call in regards to scheduling Pre-operative exam

## 2022-11-12 ENCOUNTER — Telehealth: Payer: Self-pay | Admitting: Family Medicine

## 2022-11-12 NOTE — Telephone Encounter (Addendum)
Pt called to inform MD that the Ortho (Dr. Melrose Nakayama)  will be contacting MD for Medical info, so that Pt can be scheduled for surgery of his left hip   Pt would like to ensure that we provide this MD with all the necessary history.

## 2022-11-16 ENCOUNTER — Encounter: Payer: Self-pay | Admitting: Family Medicine

## 2022-11-16 ENCOUNTER — Ambulatory Visit (INDEPENDENT_AMBULATORY_CARE_PROVIDER_SITE_OTHER): Payer: 59 | Admitting: Family Medicine

## 2022-11-16 VITALS — BP 120/72 | HR 75 | Temp 98.7°F | Ht 74.41 in | Wt 243.4 lb

## 2022-11-16 DIAGNOSIS — Z01818 Encounter for other preprocedural examination: Secondary | ICD-10-CM | POA: Diagnosis not present

## 2022-11-16 DIAGNOSIS — E119 Type 2 diabetes mellitus without complications: Secondary | ICD-10-CM

## 2022-11-16 LAB — POCT GLYCOSYLATED HEMOGLOBIN (HGB A1C): Hemoglobin A1C: 7.9 % — AB (ref 4.0–5.6)

## 2022-11-16 MED ORDER — EMPAGLIFLOZIN 25 MG PO TABS: 25.0000 mg | ORAL_TABLET | Freq: Every day | ORAL | 3 refills | Status: DC

## 2022-11-16 NOTE — Patient Instructions (Signed)
A1C 7.9%  Let's increase the Jardiance to 25 mg  Tighten up diet, especially in advance of surgery.

## 2022-11-16 NOTE — Progress Notes (Signed)
Established Patient Office Visit  Subjective   Patient ID: John Frye, male    DOB: 1956/03/11  Age: 67 y.o. MRN: [id number]  Chief Complaint  Patient presents with   Pre-op Exam    HPI   [name] is seen for preoperative assessment.  He has history of hypothyroidism, type 2 diabetes, ulcerative colitis, obstructive sleep apnea.  He had previous right total hip replacement is looking at left total hip in March.  No cardiac history.  No history of chronic lung disease.  Non-smoker.  Denies any recent chest pains or dyspnea.  No fevers or chills.  Does have type 2 diabetes.  He is currently on Jardiance 10 mg daily and metformin 1000 mg twice daily.  Not monitoring blood sugars regularly.  Some recent poor compliance with diet.  He also had recent steroid injections about a week and a half ago both knees.  Last A1c was 7.1%  Past Medical History:  Diagnosis Date   ALLERGIC RHINITIS 05/12/2008   BACK PAIN 05/08/2008   Dermatophytosis of nail 05/08/2008   GRAVES DISEASE 05/08/2008   HYPOTHYROIDISM 10/10/2009   INSOMNIA 06/17/2010   OBSTRUCTIVE SLEEP APNEA 05/08/2008   PLANTAR FASCIITIS 05/08/2008   PREDIABETES 09/18/2010   SHOULDER PAIN, BILATERAL 10/10/2009   Past Surgical History:  Procedure Laterality Date   CYST REMOVAL LEG     Right toe   TONSILLECTOMY      reports that he has never smoked. He has never used smokeless tobacco. He reports current alcohol use of about 4.0 standard drinks of alcohol per week. He reports that he does not use drugs. family history includes Alcohol abuse in his father; Stroke (age of onset: 23) in his father. No Known Allergies  Review of Systems  Constitutional:  Negative for chills, fever and malaise/fatigue.  Eyes:  Negative for blurred vision.  Respiratory:  Negative for shortness of breath.   Cardiovascular:  Negative for chest pain.  Gastrointestinal:  Negative for abdominal pain.  Neurological:  Negative for dizziness, weakness and headaches.       Objective:     BP 120/72 (BP Location: Left Arm, Patient Position: Sitting, Cuff Size: Large)   Pulse 75   Temp 98.7 F (37.1 C) (Oral)   Ht 6' 2.41" (1.89 m)   Wt 243 lb 6.4 oz (110.4 kg)   SpO2 98%   BMI 30.91 kg/m  BP Readings from Last 3 Encounters:  11/16/22 120/72  08/24/22 110/70  03/20/22 104/60   Wt Readings from Last 3 Encounters:  11/16/22 243 lb 6.4 oz (110.4 kg)  08/24/22 240 lb 8 oz (109.1 kg)  03/20/22 242 lb 3.2 oz (109.9 kg)      Physical Exam Vitals reviewed.  Constitutional:      Appearance: He is well-developed.  Eyes:     Pupils: Pupils are equal, round, and reactive to light.  Neck:     Thyroid: No thyromegaly.     Comments: No carotid bruits Cardiovascular:     Rate and Rhythm: Normal rate. Rhythm irregular.     Heart sounds: No murmur heard.    No gallop.  Pulmonary:     Effort: Pulmonary effort is normal. No respiratory distress.     Breath sounds: Normal breath sounds. No wheezing or rales.  Musculoskeletal:     Cervical back: Neck supple.  Neurological:     Mental Status: He is alert and oriented to person, place, and time.      Results for orders placed or  performed in visit on 11/16/22  POC HgB A1c  Result Value Ref Range   Hemoglobin A1C 7.9 (A) 4.0 - 5.6 %   HbA1c POC (<> result, manual entry)     HbA1c, POC (prediabetic range)     HbA1c, POC (controlled diabetic range)      Last CBC Lab Results  Component Value Date   WBC 7.8 03/20/2022   HGB 15.3 03/20/2022   HCT 45.8 03/20/2022   MCV 89.1 03/20/2022   MCH 30.6 09/11/2020   RDW 13.4 03/20/2022   PLT 295.0 [id number]   Last metabolic panel Lab Results  Component Value Date   GLUCOSE 157 (H) 03/20/2022   NA 138 03/20/2022   K 4.4 03/20/2022   CL 100 03/20/2022   CO2 28 03/20/2022   BUN 17 03/20/2022   CREATININE 0.82 03/20/2022   CALCIUM 9.5 03/20/2022   PROT 7.0 03/20/2022   ALBUMIN 4.6 03/20/2022   BILITOT 0.7 03/20/2022   ALKPHOS 77 03/20/2022    AST 12 03/20/2022   ALT 15 03/20/2022   Last lipids Lab Results  Component Value Date   CHOL 187 03/20/2022   HDL 57.40 03/20/2022   LDLCALC 117 (H) 03/20/2022   TRIG 66.0 03/20/2022   CHOLHDL 3 03/20/2022   Last hemoglobin A1c Lab Results  Component Value Date   HGBA1C 7.9 (A) 11/16/2022      The 10-year ASCVD risk score (Arnett DK, et al., 2019) is: 17.2%    Assessment & Plan:   #1 preoperative assessment.  Their goal for major joints is A1c less than 7.8.  His A1c today is 7.9%.  See below regarding type 2 diabetes management.  No other contraindications at this time.  No indication for chest x-ray.  No concerning cardiac symptoms.  #2 type 2 diabetes with A1c 7.9%.  This may be exacerbated partly by the fact that he had a couple recent steroid injections.  Also recent poor compliance with diet.  We discussed titrating his Jardiance up to 25 mg once daily with new prescription written.  Continue metformin.  Recheck A1c in 3 to 4 months.  If A1c not improving further at that time consider possible GLP1 medications  Carolann Littler, MD

## 2022-12-08 ENCOUNTER — Encounter: Payer: Self-pay | Admitting: Family Medicine

## 2022-12-08 ENCOUNTER — Telehealth (INDEPENDENT_AMBULATORY_CARE_PROVIDER_SITE_OTHER): Payer: 59 | Admitting: Family Medicine

## 2022-12-08 VITALS — Ht 74.41 in | Wt 243.4 lb

## 2022-12-08 DIAGNOSIS — E119 Type 2 diabetes mellitus without complications: Secondary | ICD-10-CM | POA: Diagnosis not present

## 2022-12-08 MED ORDER — GLIPIZIDE 5 MG PO TABS: 5.0000 mg | ORAL_TABLET | Freq: Every day | ORAL | 0 refills | Status: DC

## 2022-12-08 NOTE — Progress Notes (Signed)
Patient ID: John Frye, male   DOB: November 19, 1955, 67 y.o.   MRN: [id number]   Virtual Visit via Video Note  I connected with John Frye on 12/08/22 at  3:00 PM EST by a video enabled telemedicine application and verified that I am speaking with the correct person using two identifiers.  Location patient: home Location provider:work or home office Persons participating in the virtual visit: patient, provider  I discussed the limitations of evaluation and management by telemedicine and the availability of in person appointments. The patient expressed understanding and agreed to proceed.   HPI:  John Frye called to discuss diabetes management options.  He had been scheduled for orthopedic surgery.  Their cutoff for doing surgery is A1c 7.7% or less.  He had recent A1c of 7.9%.  Previously had been low 7 range here.  He did have 2 steroid injections in knees recently and also had some oral steroids couple months ago for respiratory infection.  He is try to make some dietary changes and would like to get repeat A1c this Friday.  He is currently on metformin 1000 mg twice daily and also takes Jardiance 25 mg once daily.  He is inquiring regarding short-term options to get his sugar under better control sooner   ROS: See pertinent positives and negatives per HPI.  Past Medical History:  Diagnosis Date   ALLERGIC RHINITIS 05/12/2008   BACK PAIN 05/08/2008   Dermatophytosis of nail 05/08/2008   GRAVES DISEASE 05/08/2008   HYPOTHYROIDISM 10/10/2009   INSOMNIA 06/17/2010   OBSTRUCTIVE SLEEP APNEA 05/08/2008   PLANTAR FASCIITIS 05/08/2008   PREDIABETES 09/18/2010   SHOULDER PAIN, BILATERAL 10/10/2009    Past Surgical History:  Procedure Laterality Date   CYST REMOVAL LEG     Right toe   TONSILLECTOMY      Family History  Problem Relation Age of Onset   Alcohol abuse Father    Stroke Father 33    SOCIAL HX: Non-smoker   Current Outpatient Medications:    clonazePAM (KLONOPIN) 0.5 MG  tablet, Take 1 tablet (0.5 mg total) by mouth at bedtime as needed., Disp: 90 tablet, Rfl: 0   cyclobenzaprine (FLEXERIL) 10 MG tablet, Take 1 tablet (10 mg total) by mouth 3 (three) times daily as needed for muscle spasms., Disp: 60 tablet, Rfl: 0   empagliflozin (JARDIANCE) 25 MG TABS tablet, Take 1 tablet (25 mg total) by mouth daily before breakfast., Disp: 90 tablet, Rfl: 3   glipiZIDE (GLUCOTROL) 5 MG tablet, Take 1 tablet (5 mg total) by mouth daily before breakfast., Disp: 30 tablet, Rfl: 0   glucose blood (ONE TOUCH ULTRA TEST) test strip, USE 1 STRIP 2 TIMES DAILY  AS NEEDED Dx Code E11.9, Disp: 200 each, Rfl: 3   levothyroxine (SYNTHROID) 112 MCG tablet, TAKE 1 TABLET DAILY BEFORE BREAKFAST, Disp: 90 tablet, Rfl: 1   metFORMIN (GLUCOPHAGE) 500 MG tablet, TAKE 2 TABLETS BY MOUTH  TWICE DAILY WITH MEALS, Disp: 360 tablet, Rfl: 1   OneTouch Delica Lancets 99991111 MISC, Use 1 lancet daily to check blood sugar level. Dx Code E11.9, Disp: 100 each, Rfl: 3   vedolizumab (ENTYVIO) 300 MG injection, Inject 300 mg into the vein every 8 (eight) weeks., Disp: , Rfl:   EXAM:  VITALS per patient if applicable:  GENERAL: alert, oriented, appears well and in no acute distress  HEENT: atraumatic, conjunttiva clear, no obvious abnormalities on inspection of external nose and ears  NECK: normal movements of the head and neck  LUNGS: on inspection no signs of respiratory distress, breathing rate appears normal, no obvious gross SOB, gasping or wheezing  CV: no obvious cyanosis  MS: moves all visible extremities without noticeable abnormality  PSYCH/NEURO: pleasant and cooperative, no obvious depression or anxiety, speech and thought processing grossly intact  ASSESSMENT AND PLAN:  Discussed the following assessment and plan:  Type 2 diabetes with recent suboptimal control.  We discussed options.  Decided to add low-dose glipizide 5 mg once daily with breakfast and he is cautioned about potential  for hypoglycemia with this.  Continue current dose of metformin as well as Jardiance.  -Will place future order for A1c this Friday -Continue low glycemic diet     I discussed the assessment and treatment plan with the patient. The patient was provided an opportunity to ask questions and all were answered. The patient agreed with the plan and demonstrated an understanding of the instructions.   The patient was advised to call back or seek an in-person evaluation if the symptoms worsen or if the condition fails to improve as anticipated.     Carolann Littler, MD

## 2022-12-11 ENCOUNTER — Other Ambulatory Visit: Payer: 59

## 2022-12-11 LAB — HEMOGLOBIN A1C: Hgb A1c MFr Bld: 7.8 % — ABNORMAL HIGH (ref 4.6–6.5)

## 2022-12-14 ENCOUNTER — Telehealth: Payer: Self-pay | Admitting: Family Medicine

## 2022-12-14 MED ORDER — EMPAGLIFLOZIN 25 MG PO TABS: 25.0000 mg | ORAL_TABLET | Freq: Every day | ORAL | 3 refills | Status: DC

## 2022-12-14 NOTE — Telephone Encounter (Signed)
Pt called to say his pharmacy does not have 90 day supplies of Jardiance.  Pt is asking that MD send an Rx to   [facility], [place] Phone: [phone]  Fax: [phone]     For the empagliflozin (JARDIANCE) 25 MG TABS tablet  -  90 day supply sent to  LOV:  11/16/22

## 2022-12-14 NOTE — Telephone Encounter (Signed)
Rx sent 

## 2022-12-16 ENCOUNTER — Encounter: Payer: Self-pay | Admitting: Family Medicine

## 2022-12-16 DIAGNOSIS — E119 Type 2 diabetes mellitus without complications: Secondary | ICD-10-CM

## 2022-12-17 ENCOUNTER — Other Ambulatory Visit: Payer: 59

## 2022-12-17 DIAGNOSIS — E119 Type 2 diabetes mellitus without complications: Secondary | ICD-10-CM

## 2022-12-17 LAB — HEMOGLOBIN A1C: Hgb A1c MFr Bld: 7.7 % — ABNORMAL HIGH (ref 4.6–6.5)

## 2022-12-18 NOTE — Telephone Encounter (Signed)
I spoke with the patient and results were faxed to [place] at [facility]

## 2022-12-22 ENCOUNTER — Telehealth: Payer: Self-pay | Admitting: Family Medicine

## 2022-12-22 NOTE — Telephone Encounter (Signed)
Pt call and stated he need his A1C results sent to [facility] attn Wells Guiles he also stated this is the fax # [phone].

## 2022-12-23 NOTE — Telephone Encounter (Signed)
A1C lab results faxed

## 2022-12-23 NOTE — Telephone Encounter (Signed)
Pt is calling checking on the status of his A1C to be fax

## 2022-12-23 NOTE — Telephone Encounter (Signed)
A1c orders faxed

## 2023-02-05 ENCOUNTER — Other Ambulatory Visit: Payer: Self-pay | Admitting: Family Medicine

## 2023-02-05 MED ORDER — CYCLOBENZAPRINE HCL 10 MG PO TABS: 10.0000 mg | ORAL_TABLET | Freq: Three times a day (TID) | ORAL | 0 refills | Status: DC | PRN

## 2023-02-05 MED ORDER — CLONAZEPAM 0.5 MG PO TABS: 0.5000 mg | ORAL_TABLET | Freq: Every evening | ORAL | 0 refills | Status: AC | PRN

## 2023-02-05 NOTE — Telephone Encounter (Signed)
error 

## 2023-02-05 NOTE — Telephone Encounter (Signed)
Prescription Request  02/05/2023  LOV: 11/16/2022  What is the name of the medication or equipment? clonazePAM (KLONOPIN) 0.5 MG tablet  cyclobenzaprine (FLEXERIL) 10 MG tablet   Have you contacted your pharmacy to request a refill? No   Which pharmacy would you like this sent to?   [facility] - [place], [place] - [address] [place] [address] Phone: [phone] Fax: [phone]    Patient notified that their request is being sent to the clinical staff for review and that they should receive a response within 2 business days.   Please advise at Mobile [phone] (mobile)

## 2023-02-22 ENCOUNTER — Ambulatory Visit: Payer: 59 | Admitting: Family Medicine

## 2023-03-15 DIAGNOSIS — K51211 Ulcerative (chronic) proctitis with rectal bleeding: Secondary | ICD-10-CM | POA: Diagnosis not present

## 2023-03-15 DIAGNOSIS — Z86010 Personal history of colonic polyps: Secondary | ICD-10-CM | POA: Diagnosis not present

## 2023-03-15 DIAGNOSIS — K5120 Ulcerative (chronic) proctitis without complications: Secondary | ICD-10-CM | POA: Diagnosis not present

## 2023-03-22 ENCOUNTER — Ambulatory Visit (INDEPENDENT_AMBULATORY_CARE_PROVIDER_SITE_OTHER): Payer: 59 | Admitting: Family Medicine

## 2023-03-22 ENCOUNTER — Encounter: Payer: Self-pay | Admitting: Family Medicine

## 2023-03-22 VITALS — BP 120/66 | HR 76 | Temp 98.6°F | Ht 74.41 in | Wt 236.0 lb

## 2023-03-22 DIAGNOSIS — E785 Hyperlipidemia, unspecified: Secondary | ICD-10-CM

## 2023-03-22 DIAGNOSIS — E119 Type 2 diabetes mellitus without complications: Secondary | ICD-10-CM

## 2023-03-22 DIAGNOSIS — E039 Hypothyroidism, unspecified: Secondary | ICD-10-CM | POA: Diagnosis not present

## 2023-03-22 DIAGNOSIS — Z7984 Long term (current) use of oral hypoglycemic drugs: Secondary | ICD-10-CM | POA: Diagnosis not present

## 2023-03-22 LAB — HEMOGLOBIN A1C: Hgb A1c MFr Bld: 6.7 % — ABNORMAL HIGH (ref 4.6–6.5)

## 2023-03-22 LAB — BASIC METABOLIC PANEL
BUN: 18 mg/dL (ref 6–23)
CO2: 29 mEq/L (ref 19–32)
Calcium: 9.2 mg/dL (ref 8.4–10.5)
Chloride: 102 mEq/L (ref 96–112)
Creatinine, Ser: 0.91 mg/dL (ref 0.40–1.50)
GFR: 88.77 mL/min (ref >60.00)
Glucose, Bld: 143 mg/dL — ABNORMAL HIGH (ref 70–99)
Potassium: 3.8 mEq/L (ref 3.5–5.1)
Sodium: 139 mEq/L (ref 135–145)

## 2023-03-22 LAB — LIPID PANEL
Cholesterol: 177 mg/dL (ref 0–200)
HDL: 50.2 mg/dL (ref >39.00)
LDL Cholesterol: 100 mg/dL — ABNORMAL HIGH (ref 0–99)
NonHDL: 126.87
Total CHOL/HDL Ratio: 4
Triglycerides: 132 mg/dL (ref 0.0–149.0)
VLDL: 26.4 mg/dL (ref 0.0–40.0)

## 2023-03-22 LAB — HEPATIC FUNCTION PANEL
ALT: 11 U/L (ref 0–53)
AST: 11 U/L (ref 0–37)
Albumin: 4.4 g/dL (ref 3.5–5.2)
Alkaline Phosphatase: 73 U/L (ref 39–117)
Bilirubin, Direct: 0.1 mg/dL (ref 0.0–0.3)
Total Bilirubin: 0.4 mg/dL (ref 0.2–1.2)
Total Protein: 7 g/dL (ref 6.0–8.3)

## 2023-03-22 LAB — POCT GLYCOSYLATED HEMOGLOBIN (HGB A1C): Hemoglobin A1C: 6.8 % — AB (ref 4.0–5.6)

## 2023-03-22 LAB — TSH: TSH: 2.36 u[IU]/mL (ref 0.35–5.50)

## 2023-03-22 MED ORDER — GLIPIZIDE 5 MG PO TABS: 5.0000 mg | ORAL_TABLET | Freq: Every day | ORAL | 3 refills | Status: DC

## 2023-03-22 NOTE — Progress Notes (Signed)
Established Patient Office Visit  Subjective   Patient ID: John Frye, male    DOB: 1956-01-24  Age: 67 y.o. MRN: [id number]  No chief complaint on file.   HPI   John Frye has history of obstructive sleep apnea, ulcerative colitis, hypothyroidism, type 2 diabetes, mild hyperlipidemia, and osteoarthritis.  He had recent knee replacement surgery and that went reasonably well.  His A1c's were up a bit prior to that surgery.  He is placed on Jardiance and increase to 25 mg daily along with metformin and short-term use of glipizide 5 mg in the morning.  His blood sugars did improve with glipizide.  He is concerned because he will likely lose coverage for Jardiance with his upcoming insurance plans.  He has managed to lose some weight which may be helping his blood sugar control  He takes levothyroxine 112 mcg daily and due for follow-up TSH.  He has history of mild hyperlipidemia.  Family history is unknown.  He would like to consider coronary calcium score for further restratification. Calculated 10-year risk for CAD is 17.8%  The 10-year ASCVD risk score (Arnett DK, et al., 2019) is: 17.8%   Values used to calculate the score:     Age: 73 years     Sex: Male     Is Non-Hispanic African American: No     Diabetic: Yes     Tobacco smoker: No     Systolic Blood Pressure: 120 mmHg     Is BP treated: No     HDL Cholesterol: 50.2 mg/dL     Total Cholesterol: 177 mg/dL  Review of Systems  Constitutional:  Negative for malaise/fatigue.  Eyes:  Negative for blurred vision.  Respiratory:  Negative for shortness of breath.   Cardiovascular:  Negative for chest pain.  Neurological:  Negative for dizziness, weakness and headaches.      Objective:     BP 120/66 (BP Location: Left Arm, Patient Position: Sitting, Cuff Size: Normal)   Pulse 76   Temp 98.6 F (37 C) (Oral)   Ht 6' 2.41" (1.89 m)   Wt 236 lb (107 kg)   SpO2 98%   BMI 29.97 kg/m  BP Readings from Last 3 Encounters:   03/22/23 120/66  11/16/22 120/72  08/24/22 110/70   Wt Readings from Last 3 Encounters:  03/22/23 236 lb (107 kg)  12/08/22 243 lb 6.4 oz (110.4 kg)  11/16/22 243 lb 6.4 oz (110.4 kg)      Physical Exam Vitals reviewed.  Constitutional:      Appearance: He is well-developed.  Eyes:     Pupils: Pupils are equal, round, and reactive to light.  Neck:     Thyroid: No thyromegaly.  Cardiovascular:     Rate and Rhythm: Normal rate and regular rhythm.  Pulmonary:     Effort: Pulmonary effort is normal. No respiratory distress.     Breath sounds: Normal breath sounds. No wheezing or rales.  Musculoskeletal:     Cervical back: Neck supple.  Neurological:     Mental Status: He is alert and oriented to person, place, and time.      Results for orders placed or performed in visit on 03/22/23  TSH  Result Value Ref Range   TSH 2.36 0.35 - 5.50 uIU/mL  Hepatic function panel  Result Value Ref Range   Total Bilirubin 0.4 0.2 - 1.2 mg/dL   Bilirubin, Direct 0.1 0.0 - 0.3 mg/dL   Alkaline Phosphatase 73 39 - 117 U/L  AST 11 0 - 37 U/L   ALT 11 0 - 53 U/L   Total Protein 7.0 6.0 - 8.3 g/dL   Albumin 4.4 3.5 - 5.2 g/dL  Lipid panel  Result Value Ref Range   Cholesterol 177 0 - 200 mg/dL   Triglycerides 409.8 0.0 - 149.0 mg/dL   HDL 11.91 >47.82 mg/dL   VLDL 95.6 0.0 - 21.3 mg/dL   LDL Cholesterol 086 (H) 0 - 99 mg/dL   Total CHOL/HDL Ratio 4    NonHDL 126.87   Basic metabolic panel  Result Value Ref Range   Sodium 139 135 - 145 mEq/L   Potassium 3.8 3.5 - 5.1 mEq/L   Chloride 102 96 - 112 mEq/L   CO2 29 19 - 32 mEq/L   Glucose, Bld 143 (H) 70 - 99 mg/dL   BUN 18 6 - 23 mg/dL   Creatinine, Ser 5.78 0.40 - 1.50 mg/dL   GFR 46.96 >29.52 mL/min   Calcium 9.2 8.4 - 10.5 mg/dL  Hemoglobin W4X  Result Value Ref Range   Hgb A1c MFr Bld 6.7 (H) 4.6 - 6.5 %  POC HgB A1c  Result Value Ref Range   Hemoglobin A1C 6.8 (A) 4.0 - 5.6 %   HbA1c POC (<> result, manual entry)      HbA1c, POC (prediabetic range)     HbA1c, POC (controlled diabetic range)        The 10-year ASCVD risk score (Arnett DK, et al., 2019) is: 17.8%    Assessment & Plan:   #1 type 2 diabetes improved with A1c 6.8%.  Currently controlled on his regimen including metformin, Jardiance, and glipizide.  He may be losing coverage for Jardiance soon.  Was given good Rx card to see if this would help with his coverage.  If possible would like to keep him on the [place].  We refilled the glipizide 5 mg daily and if he cannot get the Jardiance coverage could titrate the glipizide to twice daily.  Caution about risk for hypoglycemia with that.  #2 hypothyroidism on replacement.  Recheck TSH.  #3 history of mild hyperlipidemia.  Family history unknown.  We discussed pros and cons of coronary calcium score to help further risk stratify.  His calculated 10-year risk for CAD is over 17%.  We also discussed statin indications for type 2 diabetes.  He would like to get restratification with coronary calcium first.   Return in about 3 months (around 06/22/2023).    Evelena Peat, MD

## 2023-03-23 ENCOUNTER — Telehealth: Payer: Self-pay | Admitting: Family Medicine

## 2023-03-23 ENCOUNTER — Encounter: Payer: Self-pay | Admitting: Family Medicine

## 2023-03-23 MED ORDER — EMPAGLIFLOZIN 10 MG PO TABS: ORAL_TABLET | ORAL | 3 refills | Status: DC

## 2023-03-23 NOTE — Telephone Encounter (Signed)
Rx sent 

## 2023-03-23 NOTE — Telephone Encounter (Signed)
Pt requests empagliflozin (JARDIANCE) 10 MG TABS tablet is rerouted to  [employer] [employer] [facility]) [place], [place] - [address] [place] Phone: [phone]  Fax: [phone]    So that he can get the 90d supply before he changes insurance

## 2023-03-23 NOTE — Addendum Note (Signed)
Addended by: Christy Sartorius on: 03/23/2023 04:50 PM   Modules accepted: Orders

## 2023-03-23 NOTE — Telephone Encounter (Signed)
Pt called wrong pharm please send to [facility] - [place], [place] - [address] Phone: [phone]  Fax: [phone]

## 2023-04-22 ENCOUNTER — Ambulatory Visit (HOSPITAL_BASED_OUTPATIENT_CLINIC_OR_DEPARTMENT_OTHER)
Admission: RE | Admit: 2023-04-22 | Discharge: 2023-04-22 | Disposition: A | Discharge status: Home / Self Care | Payer: 59 | Source: Ambulatory Visit | Attending: Family Medicine | Admitting: Family Medicine

## 2023-04-22 DIAGNOSIS — E119 Type 2 diabetes mellitus without complications: Secondary | ICD-10-CM | POA: Insufficient documentation

## 2023-04-27 ENCOUNTER — Ambulatory Visit (INDEPENDENT_AMBULATORY_CARE_PROVIDER_SITE_OTHER): Payer: Medicare HMO | Admitting: Family Medicine

## 2023-04-27 ENCOUNTER — Encounter: Payer: Self-pay | Admitting: Family Medicine

## 2023-04-27 VITALS — BP 112/70 | HR 95 | Temp 98.5°F | Ht 74.41 in | Wt 234.3 lb

## 2023-04-27 DIAGNOSIS — I77810 Thoracic aortic ectasia: Secondary | ICD-10-CM | POA: Diagnosis not present

## 2023-04-27 DIAGNOSIS — E785 Hyperlipidemia, unspecified: Secondary | ICD-10-CM

## 2023-04-27 DIAGNOSIS — R931 Abnormal findings on diagnostic imaging of heart and coronary circulation: Secondary | ICD-10-CM

## 2023-04-27 NOTE — Progress Notes (Signed)
Established Patient Office Visit  Subjective   Patient ID: John Frye, male    DOB: 1956/01/24  Age: 67 y.o. MRN: [id number]  Chief Complaint  Patient presents with   Results    HPI   Trinna Post is seen to discuss recent coronary calcium score and imaging which showed mildly dilated proximal ascending aorta.  His chronic problems include history of obstructive sleep apnea, ulcerative colitis, hypothyroidism, type 2 diabetes, osteoarthritis.  He had recent lipids with total cholesterol 177, triglycerides 132, HDL 50, LDL 100.  He was reluctant to consider statins.  We recommend he consider coronary calcium score.  This came back with total score of 73.3 which was 50th percentile for age and gender with most of this in the LAD.  There is also mention of mildly dilated ascending aorta measuring 40 mm at the bifurcation of the main pulmonary artery.  Past Medical History:  Diagnosis Date   ALLERGIC RHINITIS 05/12/2008   BACK PAIN 05/08/2008   Dermatophytosis of nail 05/08/2008   GRAVES DISEASE 05/08/2008   HYPOTHYROIDISM 10/10/2009   INSOMNIA 06/17/2010   OBSTRUCTIVE SLEEP APNEA 05/08/2008   PLANTAR FASCIITIS 05/08/2008   PREDIABETES 09/18/2010   SHOULDER PAIN, BILATERAL 10/10/2009   Past Surgical History:  Procedure Laterality Date   CYST REMOVAL LEG     Right toe   TONSILLECTOMY      reports that he has never smoked. He has never used smokeless tobacco. He reports current alcohol use of about 4.0 standard drinks of alcohol per week. He reports that he does not use drugs. family history includes Alcohol abuse in his father; Stroke (age of onset: 36) in his father. No Known Allergies  Review of Systems  Respiratory:  Negative for shortness of breath.   Cardiovascular:  Negative for chest pain.      Objective:     BP 112/70 (BP Location: Left Arm, Patient Position: Sitting, Cuff Size: Normal)   Pulse 95   Temp 98.5 F (36.9 C) (Oral)   Ht 6' 2.41" (1.89 m)   Wt 234 lb 4.8 oz (106.3  kg)   SpO2 98%   BMI 29.75 kg/m  BP Readings from Last 3 Encounters:  04/27/23 112/70  03/22/23 120/66  11/16/22 120/72   Wt Readings from Last 3 Encounters:  04/27/23 234 lb 4.8 oz (106.3 kg)  03/22/23 236 lb (107 kg)  12/08/22 243 lb 6.4 oz (110.4 kg)      Physical Exam Vitals reviewed.  Constitutional:      Appearance: Normal appearance.  Cardiovascular:     Rate and Rhythm: Normal rate and regular rhythm.  Neurological:     Mental Status: He is alert.      No results found for any visits on 04/27/23.  Last CBC Lab Results  Component Value Date   WBC 7.8 03/20/2022   HGB 15.3 03/20/2022   HCT 45.8 03/20/2022   MCV 89.1 03/20/2022   MCH 30.6 09/11/2020   RDW 13.4 03/20/2022   PLT 295.0 03/20/2022   Last metabolic panel Lab Results  Component Value Date   GLUCOSE 143 (H) 03/22/2023   NA 139 03/22/2023   K 3.8 03/22/2023   CL 102 03/22/2023   CO2 29 03/22/2023   BUN 18 03/22/2023   CREATININE 0.91 03/22/2023   GFR 88.77 03/22/2023   CALCIUM 9.2 03/22/2023   PROT 7.0 03/22/2023   ALBUMIN 4.4 03/22/2023   BILITOT 0.4 03/22/2023   ALKPHOS 73 03/22/2023   AST 11 03/22/2023  ALT 11 03/22/2023   Last lipids Lab Results  Component Value Date   CHOL 177 03/22/2023   HDL 50.20 03/22/2023   LDLCALC 100 (H) 03/22/2023   TRIG 132.0 03/22/2023   CHOLHDL 4 03/22/2023   Last hemoglobin A1c Lab Results  Component Value Date   HGBA1C 6.7 (H) 03/22/2023   Last thyroid functions Lab Results  Component Value Date   TSH 2.36 03/22/2023      The 10-year ASCVD risk score (Arnett DK, et al., 2019) is: 17.3%    Assessment & Plan:   #1 dyslipidemia.  We recommended statin use particularly in view of his type 2 diabetes and now coronary calcium score of 73.  He is still somewhat reluctant.  He prefers low saturated fat diet and exercise.  We have recommend he consider either low-dose Crestor or Lipitor.  He will think this over.  #2 coronary calcium score  of 73.  This is almost entirely LAD.  As above, recommended he consider statin therapy and he will consider.  In the meantime focus on low saturated fat diet and regular exercise.  He has no history of smoking.  #3 mildly dilated ascending aorta near the bifurcation of the pulmonary artery of 40 mm.  Recommendation for dedicated CT angiogram of the chest to further evaluate.  He wants to check on insurance coverage first.  We have strongly advised that he get this to further clarify   No follow-ups on file.    Evelena Peat, MD

## 2023-04-27 NOTE — Patient Instructions (Signed)
Consider low dose statin use with either:  Crestor, Pravachol, Zocor, or Lipitor.    We do need to consider chest CTA (CT angiogram) to further evaluate dilated ascending aorta.

## 2023-04-29 DIAGNOSIS — K5120 Ulcerative (chronic) proctitis without complications: Secondary | ICD-10-CM | POA: Diagnosis not present

## 2023-05-11 ENCOUNTER — Telehealth: Payer: Self-pay | Admitting: Family Medicine

## 2023-05-11 MED ORDER — LEVOTHYROXINE SODIUM 112 MCG PO TABS: 112.0000 ug | ORAL_TABLET | Freq: Every day | ORAL | 1 refills | Status: DC

## 2023-05-11 MED ORDER — METFORMIN HCL 500 MG PO TABS: ORAL_TABLET | ORAL | 1 refills | Status: DC

## 2023-05-11 NOTE — Telephone Encounter (Signed)
Rx sent 

## 2023-05-11 NOTE — Telephone Encounter (Signed)
Prescription Request  05/11/2023  LOV: 04/27/2023  What is the name of the medication or equipment? levothyroxine (SYNTHROID) 112 MCG tablet  metFORMIN (GLUCOPHAGE) 500 MG tablet  Have you contacted your pharmacy to request a refill? No   Which pharmacy would you like this sent to?  [facility] - [place], [place] - [address] Phone: [phone]  Fax: [phone]    Patient notified that their request is being sent to the clinical staff for review and that they should receive a response within 2 business days.   Please advise at Mobile [phone] (mobile)

## 2023-05-11 NOTE — Addendum Note (Signed)
Addended by: Christy Sartorius on: 05/11/2023 04:03 PM   Modules accepted: Orders

## 2023-06-16 ENCOUNTER — Telehealth: Payer: Self-pay | Admitting: Family Medicine

## 2023-06-16 MED ORDER — GLIPIZIDE 5 MG PO TABS: 5.0000 mg | ORAL_TABLET | Freq: Every day | ORAL | 1 refills | Status: DC

## 2023-06-16 NOTE — Telephone Encounter (Signed)
Prescription Request  06/16/2023  LOV: 04/27/2023  What is the name of the medication or equipment?  glipiZIDE glipiZIDE (GLUCOTROL) 5 MG tablet  Have you contacted your pharmacy to request a refill? Yes   Which pharmacy would you like this sent to?   [facility] [phone] - [place], [place] -  [phone] [place] [address] Phone: [phone] Fax: [phone]    Patient notified that their request is being sent to the clinical staff for review and that they should receive a response within 2 business days.   Please advise at Mobile [phone] [facility])    [facility] [phone] - [place], [place] - [address] Phone: [phone]  Fax: [phone]

## 2023-06-16 NOTE — Telephone Encounter (Signed)
Rx sent 

## 2023-06-24 DIAGNOSIS — K5120 Ulcerative (chronic) proctitis without complications: Secondary | ICD-10-CM | POA: Diagnosis not present

## 2023-08-12 ENCOUNTER — Encounter: Payer: Self-pay | Admitting: Family Medicine

## 2023-08-20 DIAGNOSIS — K5120 Ulcerative (chronic) proctitis without complications: Secondary | ICD-10-CM | POA: Diagnosis not present

## 2023-09-13 DIAGNOSIS — M170 Bilateral primary osteoarthritis of knee: Secondary | ICD-10-CM | POA: Diagnosis not present

## 2023-09-13 DIAGNOSIS — M1712 Unilateral primary osteoarthritis, left knee: Secondary | ICD-10-CM | POA: Diagnosis not present

## 2023-09-13 DIAGNOSIS — M1711 Unilateral primary osteoarthritis, right knee: Secondary | ICD-10-CM | POA: Diagnosis not present

## 2023-09-24 ENCOUNTER — Other Ambulatory Visit: Payer: Self-pay | Admitting: Family Medicine

## 2023-09-27 DIAGNOSIS — L814 Other melanin hyperpigmentation: Secondary | ICD-10-CM | POA: Diagnosis not present

## 2023-09-27 DIAGNOSIS — D2271 Melanocytic nevi of right lower limb, including hip: Secondary | ICD-10-CM | POA: Diagnosis not present

## 2023-09-27 DIAGNOSIS — D225 Melanocytic nevi of trunk: Secondary | ICD-10-CM | POA: Diagnosis not present

## 2023-09-27 DIAGNOSIS — L820 Inflamed seborrheic keratosis: Secondary | ICD-10-CM | POA: Diagnosis not present

## 2023-09-27 DIAGNOSIS — D2262 Melanocytic nevi of left upper limb, including shoulder: Secondary | ICD-10-CM | POA: Diagnosis not present

## 2023-09-27 DIAGNOSIS — D224 Melanocytic nevi of scalp and neck: Secondary | ICD-10-CM | POA: Diagnosis not present

## 2023-09-27 DIAGNOSIS — L905 Scar conditions and fibrosis of skin: Secondary | ICD-10-CM | POA: Diagnosis not present

## 2023-09-27 DIAGNOSIS — D2272 Melanocytic nevi of left lower limb, including hip: Secondary | ICD-10-CM | POA: Diagnosis not present

## 2023-09-27 DIAGNOSIS — D485 Neoplasm of uncertain behavior of skin: Secondary | ICD-10-CM | POA: Diagnosis not present

## 2023-09-27 DIAGNOSIS — L821 Other seborrheic keratosis: Secondary | ICD-10-CM | POA: Diagnosis not present

## 2023-10-15 DIAGNOSIS — K5120 Ulcerative (chronic) proctitis without complications: Secondary | ICD-10-CM | POA: Diagnosis not present

## 2023-11-10 ENCOUNTER — Telehealth: Payer: Self-pay

## 2023-11-10 DIAGNOSIS — E119 Type 2 diabetes mellitus without complications: Secondary | ICD-10-CM

## 2023-11-10 NOTE — Telephone Encounter (Signed)
 Copied from CRM [id number]. Topic: Clinical - Request for Lab/Test Order >> Nov 10, 2023 10:58 AM Benton KIDD wrote: Reason for CRM: patient is calling to request lab blood work the last time he had lab was in June of last year . Please put in order for lab/ blood work

## 2023-11-10 NOTE — Addendum Note (Signed)
 Addended by: Aurelio Leer on: 11/10/2023 01:15 PM   Modules accepted: Orders

## 2023-11-11 DIAGNOSIS — K5120 Ulcerative (chronic) proctitis without complications: Secondary | ICD-10-CM | POA: Diagnosis not present

## 2023-11-11 DIAGNOSIS — K51211 Ulcerative (chronic) proctitis with rectal bleeding: Secondary | ICD-10-CM | POA: Diagnosis not present

## 2023-11-11 DIAGNOSIS — Z860100 Personal history of colon polyps, unspecified: Secondary | ICD-10-CM | POA: Diagnosis not present

## 2023-11-11 NOTE — Addendum Note (Signed)
 Addended by: Aurelio Leer on: 11/11/2023 01:44 PM   Modules accepted: Orders

## 2023-11-11 NOTE — Telephone Encounter (Signed)
 Labs placed. Patient is requesting A1C and lipid panel

## 2023-11-15 ENCOUNTER — Encounter: Payer: Self-pay | Admitting: Family Medicine

## 2023-11-15 ENCOUNTER — Ambulatory Visit (INDEPENDENT_AMBULATORY_CARE_PROVIDER_SITE_OTHER): Payer: Medicare Other | Admitting: Family Medicine

## 2023-11-15 VITALS — BP 122/70 | HR 75 | Temp 98.1°F | Wt 255.2 lb

## 2023-11-15 DIAGNOSIS — E119 Type 2 diabetes mellitus without complications: Secondary | ICD-10-CM

## 2023-11-15 DIAGNOSIS — Z23 Encounter for immunization: Secondary | ICD-10-CM

## 2023-11-15 DIAGNOSIS — E038 Other specified hypothyroidism: Secondary | ICD-10-CM

## 2023-11-15 DIAGNOSIS — Z7984 Long term (current) use of oral hypoglycemic drugs: Secondary | ICD-10-CM

## 2023-11-15 DIAGNOSIS — E785 Hyperlipidemia, unspecified: Secondary | ICD-10-CM

## 2023-11-15 LAB — POCT GLYCOSYLATED HEMOGLOBIN (HGB A1C): Hemoglobin A1C: 7 % — AB (ref 4.0–5.6)

## 2023-11-15 NOTE — Patient Instructions (Addendum)
 Mild dilation of ascending aorta 4.1 cm on recent coronary calcium scan.  We need to consider annual CT angiogram or MR angiogram to reassess  Consider setting up physical at follow up this Summer.

## 2023-11-15 NOTE — Progress Notes (Signed)
 Established Patient Office Visit  Subjective   Patient ID: John Frye, male    DOB: 26-Jul-1956  Age: 68 y.o. MRN: [id number]  Chief Complaint  Patient presents with   Medical Management of Chronic Issues    HPI   John Frye is here for medical follow-up.  He has hypothyroidism, type 2 diabetes, mild hyperlipidemia.  He has gained some weight since last summer.  Has been going to the gym more regularly and thinks some of this may be weight gain related to that (muscle gain).  His diabetic medications include glipizide , Januvia, and metformin .  He does have mild hyperlipidemia.  Recent coronary calcium score 73.  He has declined statin use.  Most recent LDL cholesterol 100.  He has not had any test strips with his machine recently so has not been monitoring blood sugars.  No recent hypoglycemic symptoms.  Last A1c 6.7%.  TSH was at goal when checked last summer.  Past Medical History:  Diagnosis Date   ALLERGIC RHINITIS 05/12/2008   BACK PAIN 05/08/2008   Dermatophytosis of nail 05/08/2008   GRAVES DISEASE 05/08/2008   HYPOTHYROIDISM 10/10/2009   INSOMNIA 06/17/2010   OBSTRUCTIVE SLEEP APNEA 05/08/2008   PLANTAR FASCIITIS 05/08/2008   PREDIABETES 09/18/2010   SHOULDER PAIN, BILATERAL 10/10/2009   Past Surgical History:  Procedure Laterality Date   CYST REMOVAL LEG     Right toe   TONSILLECTOMY      reports that he has never smoked. He has never used smokeless tobacco. He reports current alcohol use of about 4.0 standard drinks of alcohol per week. He reports that he does not use drugs. family history includes Alcohol abuse in his father; Stroke (age of onset: 49) in his father. No Known Allergies  Review of Systems  Constitutional:  Negative for malaise/fatigue.  Eyes:  Negative for blurred vision.  Respiratory:  Negative for shortness of breath.   Cardiovascular:  Negative for chest pain.  Neurological:  Negative for dizziness, weakness and headaches.      Objective:     BP  122/70 (BP Location: Left Arm, Patient Position: Sitting, Cuff Size: Normal)   Pulse 75   Temp 98.1 F (36.7 C) (Oral)   Wt 255 lb 3.2 oz (115.8 kg)   SpO2 96%   BMI 32.41 kg/m  BP Readings from Last 3 Encounters:  11/15/23 122/70  04/27/23 112/70  03/22/23 120/66   Wt Readings from Last 3 Encounters:  11/15/23 255 lb 3.2 oz (115.8 kg)  04/27/23 234 lb 4.8 oz (106.3 kg)  03/22/23 236 lb (107 kg)      Physical Exam Vitals reviewed.  Constitutional:      General: He is not in acute distress.    Appearance: He is not ill-appearing.  Cardiovascular:     Rate and Rhythm: Normal rate and regular rhythm.  Pulmonary:     Effort: Pulmonary effort is normal.     Breath sounds: Normal breath sounds.  Neurological:     Mental Status: He is alert.      No results found for any visits on 11/15/23.  Last CBC Lab Results  Component Value Date   WBC 7.8 03/20/2022   HGB 15.3 03/20/2022   HCT 45.8 03/20/2022   MCV 89.1 03/20/2022   MCH 30.6 09/11/2020   RDW 13.4 03/20/2022   PLT 295.0 03/20/2022   Last metabolic panel Lab Results  Component Value Date   GLUCOSE 143 (H) 03/22/2023   NA 139 03/22/2023   K 3.8  03/22/2023   CL 102 03/22/2023   CO2 29 03/22/2023   BUN 18 03/22/2023   CREATININE 0.91 03/22/2023   GFR 88.77 03/22/2023   CALCIUM 9.2 03/22/2023   PROT 7.0 03/22/2023   ALBUMIN 4.4 03/22/2023   BILITOT 0.4 03/22/2023   ALKPHOS 73 03/22/2023   AST 11 03/22/2023   ALT 11 03/22/2023   Last lipids Lab Results  Component Value Date   CHOL 177 03/22/2023   HDL 50.20 03/22/2023   LDLCALC 100 (H) 03/22/2023   TRIG 132.0 03/22/2023   CHOLHDL 4 03/22/2023   Last hemoglobin A1c Lab Results  Component Value Date   HGBA1C 6.7 (H) 03/22/2023   Last thyroid  functions Lab Results  Component Value Date   TSH 2.36 03/22/2023      The 10-year ASCVD risk score (Arnett DK, et al., 2019) is: 19.8%    Assessment & Plan:   #1 type 2 diabetes currently treated  with metformin , glipizide , and John Frye .  Recheck A1c today--up slightly to 7.0.  He would like to tighten up diet and recheck at 71-month follow-up without any further intervention at this time  #2 hyperlipidemia.  Recent LDL cholesterol 100.  We had previously encouraged statin use with his coronary calcium score 73 and diabetes history but he has declined.  Continue low saturated fat diet.  Continue regular exercise habits.  #3 hypothyroidism treated with levothyroxine .  TSH was at goal last July.  Recheck yearly.  #4 mildly dilated ascending aorta 4.1 cm.  Needs annual CTA or MRA for follow-up.   No follow-ups on file.    Glean Lamy, MD

## 2023-12-14 DIAGNOSIS — K51211 Ulcerative (chronic) proctitis with rectal bleeding: Secondary | ICD-10-CM | POA: Diagnosis not present

## 2023-12-14 DIAGNOSIS — K5120 Ulcerative (chronic) proctitis without complications: Secondary | ICD-10-CM | POA: Diagnosis not present

## 2023-12-16 ENCOUNTER — Encounter: Payer: Self-pay | Admitting: Family Medicine

## 2023-12-17 MED ORDER — ONETOUCH VERIO W/DEVICE KIT: PACK | 0 refills | Status: DC

## 2023-12-20 DIAGNOSIS — M25561 Pain in right knee: Secondary | ICD-10-CM | POA: Diagnosis not present

## 2023-12-20 DIAGNOSIS — M25562 Pain in left knee: Secondary | ICD-10-CM | POA: Diagnosis not present

## 2023-12-22 DIAGNOSIS — M170 Bilateral primary osteoarthritis of knee: Secondary | ICD-10-CM | POA: Diagnosis not present

## 2023-12-24 ENCOUNTER — Other Ambulatory Visit: Payer: Self-pay | Admitting: Family Medicine

## 2023-12-27 MED ORDER — ONETOUCH VERIO W/DEVICE KIT: PACK | 0 refills | Status: DC

## 2023-12-27 NOTE — Addendum Note (Signed)
 Addended by: Christy Sartorius on: 12/27/2023 08:13 AM   Modules accepted: Orders

## 2023-12-29 DIAGNOSIS — M1711 Unilateral primary osteoarthritis, right knee: Secondary | ICD-10-CM | POA: Diagnosis not present

## 2024-01-07 DIAGNOSIS — M25561 Pain in right knee: Secondary | ICD-10-CM | POA: Diagnosis not present

## 2024-01-12 DIAGNOSIS — M25561 Pain in right knee: Secondary | ICD-10-CM | POA: Diagnosis not present

## 2024-02-01 ENCOUNTER — Other Ambulatory Visit: Payer: Self-pay | Admitting: Family Medicine

## 2024-02-02 ENCOUNTER — Telehealth: Payer: Self-pay

## 2024-02-02 ENCOUNTER — Other Ambulatory Visit: Payer: Self-pay | Admitting: Family Medicine

## 2024-02-02 MED ORDER — LEVOTHYROXINE SODIUM 112 MCG PO TABS: 112.0000 ug | ORAL_TABLET | Freq: Every day | ORAL | 1 refills | Status: DC

## 2024-02-02 MED ORDER — GLUCOSE BLOOD VI STRP: ORAL_STRIP | 3 refills | Status: DC

## 2024-02-02 MED ORDER — CYCLOBENZAPRINE HCL 10 MG PO TABS: 10.0000 mg | ORAL_TABLET | Freq: Three times a day (TID) | ORAL | 0 refills | Status: AC | PRN

## 2024-02-02 MED ORDER — METFORMIN HCL 500 MG PO TABS: 1000.0000 mg | ORAL_TABLET | Freq: Two times a day (BID) | ORAL | 1 refills | Status: DC

## 2024-02-02 MED ORDER — ONETOUCH VERIO W/DEVICE KIT: PACK | 0 refills | Status: DC

## 2024-02-02 NOTE — Telephone Encounter (Signed)
 Copied from CRM [id number]. Topic: Clinical - Prescription Issue >> Feb 02, 2024  3:40 PM [name] wrote: Reason for CRM: Patient needs his test strips to go through [facility] [phone] to be covered by [facility], test strips were sent to [facility].  Alex [phone]

## 2024-02-02 NOTE — Telephone Encounter (Signed)
 Copied from CRM [id number]. Topic: Clinical - Medication Refill >> Feb 02, 2024  3:32 PM [name] wrote: Most Recent Primary Care Visit:  Provider: Marquetta Sit  Department: [facility]  Visit Type: OFFICE VISIT  Date: 11/15/2023  Medication: metFORMIN  (GLUCOPHAGE ) 500 MG tablet, levothyroxine  (SYNTHROID ) 112 MCG tablet  Has the patient contacted their pharmacy? Yes (Agent: If no, request that the patient contact the pharmacy for the refill. If patient does not wish to contact the pharmacy document the reason why and proceed with request.) (Agent: If yes, when and what did the pharmacy advise?)  Is this the correct pharmacy for this prescription? Yes If no, delete pharmacy and type the correct one.  This is the patient's preferred pharmacy:  [id number] - [place], [place] - [address] [place] [address] Phone: [phone] Fax: [phone]    Has the prescription been filled recently? No  Is the patient out of the medication? No  Has the patient been seen for an appointment in the last year OR does the patient have an upcoming appointment? Yes  Can we respond through MyChart? Yes  Agent: Please be advised that Rx refills may take up to 3 business days. We ask that you follow-up with your pharmacy.

## 2024-02-02 NOTE — Addendum Note (Signed)
 Addended by: Aurelio Leer on: 02/02/2024 04:06 PM   Modules accepted: Orders

## 2024-02-02 NOTE — Telephone Encounter (Signed)
 Rx sent

## 2024-02-03 DIAGNOSIS — S83231A Complex tear of medial meniscus, current injury, right knee, initial encounter: Secondary | ICD-10-CM | POA: Diagnosis not present

## 2024-02-03 DIAGNOSIS — Y999 Unspecified external cause status: Secondary | ICD-10-CM | POA: Diagnosis not present

## 2024-02-03 DIAGNOSIS — S83241A Other tear of medial meniscus, current injury, right knee, initial encounter: Secondary | ICD-10-CM | POA: Diagnosis not present

## 2024-02-03 DIAGNOSIS — M2241 Chondromalacia patellae, right knee: Secondary | ICD-10-CM | POA: Diagnosis not present

## 2024-02-03 DIAGNOSIS — G8918 Other acute postprocedural pain: Secondary | ICD-10-CM | POA: Diagnosis not present

## 2024-02-03 DIAGNOSIS — X58XXXA Exposure to other specified factors, initial encounter: Secondary | ICD-10-CM | POA: Diagnosis not present

## 2024-02-21 DIAGNOSIS — K5120 Ulcerative (chronic) proctitis without complications: Secondary | ICD-10-CM | POA: Diagnosis not present

## 2024-03-01 DIAGNOSIS — M25561 Pain in right knee: Secondary | ICD-10-CM | POA: Diagnosis not present

## 2024-03-07 DIAGNOSIS — K635 Polyp of colon: Secondary | ICD-10-CM | POA: Diagnosis not present

## 2024-03-07 DIAGNOSIS — Z860101 Personal history of adenomatous and serrated colon polyps: Secondary | ICD-10-CM | POA: Diagnosis not present

## 2024-03-07 DIAGNOSIS — D122 Benign neoplasm of ascending colon: Secondary | ICD-10-CM | POA: Diagnosis not present

## 2024-03-07 DIAGNOSIS — K5190 Ulcerative colitis, unspecified, without complications: Secondary | ICD-10-CM | POA: Diagnosis not present

## 2024-03-07 DIAGNOSIS — Z1211 Encounter for screening for malignant neoplasm of colon: Secondary | ICD-10-CM | POA: Diagnosis not present

## 2024-03-07 LAB — HM COLONOSCOPY

## 2024-03-09 DIAGNOSIS — M25561 Pain in right knee: Secondary | ICD-10-CM | POA: Diagnosis not present

## 2024-03-14 DIAGNOSIS — M25561 Pain in right knee: Secondary | ICD-10-CM | POA: Diagnosis not present

## 2024-03-17 DIAGNOSIS — M25561 Pain in right knee: Secondary | ICD-10-CM | POA: Diagnosis not present

## 2024-03-21 DIAGNOSIS — M25561 Pain in right knee: Secondary | ICD-10-CM | POA: Diagnosis not present

## 2024-03-28 DIAGNOSIS — M25561 Pain in right knee: Secondary | ICD-10-CM | POA: Diagnosis not present

## 2024-03-31 DIAGNOSIS — M1711 Unilateral primary osteoarthritis, right knee: Secondary | ICD-10-CM | POA: Diagnosis not present

## 2024-04-03 ENCOUNTER — Encounter: Payer: Self-pay | Admitting: Family Medicine

## 2024-04-04 MED ORDER — GLIPIZIDE 10 MG PO TABS: 10.0000 mg | ORAL_TABLET | Freq: Two times a day (BID) | ORAL | 3 refills | Status: DC

## 2024-04-17 DIAGNOSIS — K5120 Ulcerative (chronic) proctitis without complications: Secondary | ICD-10-CM | POA: Diagnosis not present

## 2024-04-19 DIAGNOSIS — M1711 Unilateral primary osteoarthritis, right knee: Secondary | ICD-10-CM | POA: Diagnosis not present

## 2024-05-01 DIAGNOSIS — M25561 Pain in right knee: Secondary | ICD-10-CM | POA: Diagnosis not present

## 2024-05-02 ENCOUNTER — Ambulatory Visit (INDEPENDENT_AMBULATORY_CARE_PROVIDER_SITE_OTHER): Admitting: Family Medicine

## 2024-05-02 DIAGNOSIS — Z0000 Encounter for general adult medical examination without abnormal findings: Secondary | ICD-10-CM | POA: Diagnosis not present

## 2024-05-02 NOTE — Progress Notes (Signed)
 PATIENT CHECK-IN and HEALTH RISK ASSESSMENT QUESTIONNAIRE:  -completed by phone/video for upcoming Medicare Preventive Visit  Pre-Visit Check-in: 1)Vitals (height, wt, BP, etc) - record in vitals section for visit on day of visit Request home vitals (wt, BP, etc.) and enter into vitals, THEN update Vital Signs SmartPhrase below at the top of the HPI. See below.  2)Review and Update Medications, Allergies PMH, Surgeries, Social history in Epic 3)Hospitalizations in the last year with date/reason? n  4)Review and Update Care Team (patient's specialists) in Epic 5) Complete PHQ9 in Epic  6) Complete Fall Screening in Epic 7)Review all Health Maintenance Due and order if not done.  Medicare Wellness Patient Questionnaire:  Answer theses question about your habits: How often do you have a drink containing alcohol?1-2 times per week How many drinks containing alcohol do you have on a typical day when you are drinking?1-2 drinks How often do you have six or more drinks on one occasion?never Have you ever smoked?n Quit date if applicable? na  How many packs a day do/did you smoke? n Do you use smokeless tobacco?n Do you use an illicit drugs?n On average, how many days per week do you engage in moderate to strenuous exercise (like a brisk walk)? Does a lot of gardening, yard-work, walking, has silver sneakers - goes to the gym 3x per week and does machines and some cycling (until meniscus) Typical diet: tries to eat lower calorie bread if eats bread, lots of veggies, wife cooks Typical snacks: tries to avoid sweets  Beverages: water  Answer theses question about your everyday activities: Can you perform most household chores?y Are you deaf or have significant trouble hearing?n Do you feel that you have a problem with memory?n Do you feel safe at home?y Last dentist visit? Goes at least once a year 8. Do you have any difficulty performing your everyday activities?n Are you having any  difficulty walking, taking medications on your own, and or difficulty managing daily home needs?n Do you have difficulty walking or climbing stairs?n Do you have difficulty dressing or bathing?n Do you have difficulty doing errands alone such as visiting a doctor's office or shopping?n Do you currently have any difficulty preparing food and eating?n Do you currently have any difficulty using the toilet?n Do you have any difficulty managing your finances?n Do you have any difficulties with housekeeping of managing your housekeeping?n   Do you have Advanced Directives in place (Living Will, Healthcare Power or Attorney)? y   Last eye Exam and location? Reports is due for eye exam, usually goes [facility]   Do you currently use prescribed or non-prescribed narcotic or opioid pain medications?n  Do you have a history or close family history of breast, ovarian, tubal or peritoneal cancer or a family member with BRCA (breast cancer susceptibility 1 and 2) gene mutations? Unknown     ----------------------------------------------------------------------------------------------------------------------------------------------------------------------------------------------------------------------  Because this visit was a virtual/telehealth visit, some criteria may be missing or patient reported. Any vitals not documented were not able to be obtained and vitals that have been documented are patient reported.    MEDICARE ANNUAL PREVENTIVE CARE VISIT WITH PROVIDER (Welcome to Medicare, initial annual wellness or annual wellness exam)  Virtual Visit via Phone Note  I connected with Rafal Olivencia on 05/02/24  by phone and verified that I am speaking with the correct person using two identifiers.  Location patient: home Location provider:work or home office Persons participating in the virtual visit: patient, provider  Concerns and/or follow up  today: reports is doing well - had  meniscus surgery in May. Had MRI Monday and has follow up soon. Some sleep issues. Falls asleep ok, but then once wakes up to go to the bathroom struggles to go back to sleep.   See HM section in Epic for other details of completed HM.    ROS: negative for report of fevers, unintentional weight loss, vision changes, vision loss, hearing loss or change, chest pain, sob, hemoptysis, melena, hematochezia, hematuria, falls, bleeding or bruising, thoughts of suicide or self harm, memory loss  Patient-completed extensive health risk assessment - reviewed and discussed with the patient: See Health Risk Assessment completed with patient prior to the visit either above or in recent phone note. This was reviewed in detailed with the patient today and appropriate recommendations, orders and referrals were placed as needed per Summary below and patient instructions.   Review of Medical History: -PMH, PSH, Family History and current specialty and care providers reviewed and updated and listed below   Patient Care Team: Micheal Wolm ORN, MD as PCP - General   Past Medical History:  Diagnosis Date   ALLERGIC RHINITIS 05/12/2008   BACK PAIN 05/08/2008   Dermatophytosis of nail 05/08/2008   GRAVES DISEASE 05/08/2008   HYPOTHYROIDISM 10/10/2009   INSOMNIA 06/17/2010   OBSTRUCTIVE SLEEP APNEA 05/08/2008   PLANTAR FASCIITIS 05/08/2008   PREDIABETES 09/18/2010   SHOULDER PAIN, BILATERAL 10/10/2009    Past Surgical History:  Procedure Laterality Date   CYST REMOVAL LEG     Right toe   TONSILLECTOMY      Social History   Socioeconomic History   Marital status: Married    Spouse name: Not on file   Number of children: Not on file   Years of education: Not on file   Highest education level: Not on file  Occupational History   Not on file  Tobacco Use   Smoking status: Never   Smokeless tobacco: Never  Vaping Use   Vaping status: Never Used  Substance and Sexual Activity   Alcohol use: Yes     Alcohol/week: 4.0 standard drinks of alcohol    Types: 2 Cans of beer, 2 Glasses of wine per week    Comment: Per week   Drug use: Never   Sexual activity: Not on file  Other Topics Concern   Not on file  Social History Narrative   Raised in a foster family   Social Drivers of Corporate investment banker Strain: Not on file  Food Insecurity: Not on file  Transportation Needs: Not on file  Physical Activity: Not on file  Stress: Not on file  Social Connections: Unknown (02/05/2022)   Received from [facility]   Social Network    Social Network: Not on file  Intimate Partner Violence: Unknown (01/08/2022)   Received from [facility]   HITS    Physically Hurt: Not on file    Insult or Talk Down To: Not on file    Threaten Physical Harm: Not on file    Scream or Curse: Not on file    Family History  Problem Relation Age of Onset   Alcohol abuse Father    Stroke Father 61    Current Outpatient Medications on File Prior to Visit  Medication Sig Dispense Refill   Blood Glucose Monitoring Suppl (ONETOUCH VERIO) w/Device KIT Use as directed to check blood sugar daily 1 kit 0   clonazePAM  (KLONOPIN ) 0.5 MG tablet Take 1 tablet (0.5 mg  total) by mouth at bedtime as needed. 90 tablet 0   cyclobenzaprine  (FLEXERIL ) 10 MG tablet Take 1 tablet (10 mg total) by mouth 3 (three) times daily as needed for muscle spasms. 60 tablet 0   empagliflozin  (JARDIANCE ) 10 MG TABS tablet Take 1 tablet  by mouth once daily. 90 tablet 3   glipiZIDE  (GLUCOTROL ) 10 MG tablet Take 1 tablet (10 mg total) by mouth 2 (two) times daily before a meal. 60 tablet 3   glucose blood (ONE TOUCH ULTRA TEST) test strip USE 1 STRIP 2 TIMES DAILY  AS NEEDED Dx Code E11.9 200 each 3   glucose blood test strip Use as instructed to check blood sugar daily 100 each 3   levothyroxine  (SYNTHROID ) 112 MCG tablet Take 1 tablet (112 mcg total) by mouth daily before breakfast. 90 tablet 1   metFORMIN  (GLUCOPHAGE ) 500 MG tablet  Take 2 tablets (1,000 mg total) by mouth 2 (two) times daily with a meal. 360 tablet 1   OneTouch Delica Lancets 33G MISC Use 1 lancet daily to check blood sugar level. Dx Code E11.9 100 each 3   vedolizumab  (ENTYVIO ) 300 MG injection Inject 300 mg into the vein every 8 (eight) weeks.     No current facility-administered medications on file prior to visit.    No Known Allergies     Physical Exam Vitals requested from patient and listed below if patient had equipment and was able to obtain at home for this virtual visit: There were no vitals filed for this visit. Estimated body mass index is 32.41 kg/m as calculated from the following:   Height as of 04/27/23: 6' 2.41 (1.89 m).   Weight as of 11/15/23: 255 lb 3.2 oz (115.8 kg).  EKG (optional): deferred due to virtual visit  GENERAL: alert, oriented, no acute distress detected; full vision exam deferred due to pandemic and/or virtual encounter  PSYCH/NEURO: pleasant and cooperative, no obvious depression or anxiety, speech and thought processing grossly intact, Cognitive function grossly intact  Flowsheet Row Office Visit from 08/24/2022 in [facility] [place]  PHQ-9 Total Score 9        05/02/2024   12:14 PM 11/16/2022    2:32 PM 08/24/2022   11:48 AM 03/20/2022    7:05 AM 08/27/2020    2:15 PM  Depression screen PHQ 2/9  Decreased Interest  0 0 0 0  Down, Depressed, Hopeless 0 0 0 0 0  PHQ - 2 Score 0 0 0 0 0  Altered sleeping   3  1  Tired, decreased energy   2  1  Change in appetite   2  0  Feeling bad or failure about yourself    0  0  Trouble concentrating   2  0  Moving slowly or fidgety/restless   0  0  Suicidal thoughts   0  0  PHQ-9 Score   9  2  Difficult doing work/chores   Not difficult at all  Not difficult at all       03/20/2022    7:06 AM 08/24/2022   11:49 AM 11/16/2022    2:32 PM 05/02/2024   12:13 PM  Fall Risk  Falls in the past year? 0 0 0 1  Was there an injury with  Fall? 0 0 0 0  Fall Risk Category Calculator 0 0 0 1  Fall Risk Category (Retired) Low  Low     (RETIRED) Patient Fall Risk Level Low fall risk  Low fall risk     Patient at Risk for Falls Due to No Fall Risks No Fall Risks No Fall Risks   Fall risk Follow up Falls evaluation completed  Falls evaluation completed  Falls evaluation completed Falls evaluation completed     Data saved with a previous flowsheet row definition     SUMMARY AND PLAN:  Encounter for Medicare annual wellness exam   Discussed applicable health maintenance/preventive health measures and advised and referred or ordered per patient preferences: -discussed vaccines due and advised can get at the pharmacy, advised to bring proof of receipt when does so that we can update record -he agrees to schedule eye exam -he also plans to schedule in office visit for foot exam, labs and follow up Health Maintenance  Topic Date Due   Diabetic kidney evaluation - Urine ACR  08/17/2014   FOOT EXAM  10/23/2017   DTaP/Tdap/Td (2 - Tdap) 04/25/2020   COVID-19 Vaccine (4 - 2024-25 season) 06/06/2023   OPHTHALMOLOGY EXAM  07/14/2023   Diabetic kidney evaluation - eGFR measurement  03/21/2024   INFLUENZA VACCINE  05/05/2024   HEMOGLOBIN A1C  05/14/2024   Medicare Annual Wellness (AWV)  05/02/2025   Colonoscopy  03/07/2034   Pneumococcal Vaccine: 50+ Years  Completed   Hepatitis C Screening  Completed   Zoster Vaccines- Shingrix   Completed   Hepatitis B Vaccines  Aged Out   HPV VACCINES  Aged Out   Meningococcal B Vaccine  Aged [employer] and counseling on the following was provided based on the above review of health and a plan/checklist for the patient, along with additional information discussed, was provided for the patient in the patient instructions :  -discussed sleep and sleep counseling provided, see patient instructions for further details.  -Provided safe balance exercises that can be done at home to improve  balance - see patient instructions -Advised and counseled on a healthy lifestyle  -Reviewed patient's current diet. Advised and counseled on a whole foods based healthy diet. A summary of a healthy diet was provided in the Patient Instructions. Encouraged to avoid foods and beverages with added sugar/sweetener, ultra-processed grains and excess highly starchy veggies. Discussed better veggies and fruits to choose with diabetes and portion/servings per day. Discussed consuming adequate proteins and healthy sources of protein.  -reviewed patient's current physical activity level and discussed exercise guidelines for adults. Congratulated on healthy choices. Further resources provided in patient instructions.  -Advise yearly dental visits at minimum and regular eye exams -Advised and counseled on alcohol safe limits Follow up: see patient instructions   Patient Instructions  I really enjoyed getting to talk with you today! I am available on Tuesdays and Thursdays for virtual visits if you have any questions or concerns, or if I can be of any further assistance.   CHECKLIST FROM ANNUAL WELLNESS VISIT:  -Follow up (please call to schedule if not scheduled after visit):   -schedule your follow up in the office with Dr. Micheal sometime in the next 1-2 months.    -yearly for annual wellness visit with primary care office  Here is a list of your preventive care/health maintenance measures and the plan for each if any are due:  PLAN For any measures below that may be due:     1. Can get the Tdap vaccine and the flu and covid vaccines in the fall at the pharmacy. Please provide proof of receipt when you do so that we can update your  records here. Thanks!   2. Schedule in office follow up for your foot exam, labs and diabetic kidney exam.   3. Call you eye doctor to set up a diabetic eye exam. Please request a copy of the report to give to Dr. Micheal. Thanks!  Health Maintenance  Topic Date  Due   Diabetic kidney evaluation - Urine ACR  08/17/2014   FOOT EXAM  10/23/2017   DTaP/Tdap/Td (2 - Tdap) 04/25/2020   COVID-19 Vaccine (4 - 2024-25 season) 06/06/2023   OPHTHALMOLOGY EXAM  07/14/2023   Diabetic kidney evaluation - eGFR measurement  03/21/2024   INFLUENZA VACCINE  05/05/2024   HEMOGLOBIN A1C  05/14/2024   Medicare Annual Wellness (AWV)  05/02/2025   Colonoscopy  03/07/2034   Pneumococcal Vaccine: 50+ Years  Completed   Hepatitis C Screening  Completed   Zoster Vaccines- Shingrix   Completed   Hepatitis B Vaccines  Aged Out   HPV VACCINES  Aged Out   Meningococcal B Vaccine  Aged Out    -See a dentist at least yearly  -Get your eyes checked and then per your eye specialist's recommendations  -Other issues addressed today:   -I have included below further information regarding a healthy whole foods based diet, physical activity guidelines for adults, stress management and opportunities for social connections. I hope you find this information useful.   -----------------------------------------------------------------------------------------------------------------------------------------------------------------------------------------------------------------------------------------------------------    NUTRITION: -eat real food: lots of colorful vegetables (half the plate) and fruits -5-7 servings of vegetables and fruits per day (fresh or steamed is best), exp. 2 servings of vegetables with lunch and dinner and 2 servings of fruit per day. Berries and greens such as kale and collards are great choices.  -consume on a regular basis:  fresh fruits, fresh veggies, fish, nuts, seeds, healthy oils (such as olive oil, avocado oil), whole grains (make sure for bread/pasta/crackers/etc., that the first ingredient on label contains the word whole), legumes. -can eat small amounts of dairy and lean meat (no larger than the palm of your hand), but avoid processed meats such  as ham, bacon, lunch meat, etc. -drink water -try to avoid fast food and pre-packaged foods, processed meat, ultra processed foods/beverages (donuts, candy, etc.) -most experts advise limiting sodium to < 2300mg  per day, should limit further is any chronic conditions such as high blood pressure, heart disease, diabetes, etc. The American Heart Association advised that < 1500mg  is is ideal -try to avoid foods/beverages that contain any ingredients with names you do not recognize  -try to avoid foods/beverages  with added sugar or sweeteners/sweets  -try to avoid sweet drinks (including diet drinks): soda, juice, Gatorade, sweet tea, power drinks, diet drinks -try to avoid white rice, white bread, pasta (unless whole grain)  EXERCISE GUIDELINES FOR ADULTS: -if you wish to increase your physical activity, do so gradually and with the approval of your doctor -STOP and seek medical care immediately if you have any chest pain, chest discomfort or trouble breathing when starting or increasing exercise  -move and stretch your body, legs, feet and arms when sitting for long periods -Physical activity guidelines for optimal health in adults: -get at least 150 minutes per week of moderate exercise (can talk, but not sing); this is about 20-30 minutes of sustained activity 5-7 days per week or two 10-15 minute episodes of sustained activity 5-7 days per week -do some muscle building/resistance training/strength training at least 2 days per week  -balance exercises 3+ days per week:   Stand somewhere where  you have something sturdy to hold onto if you lose balance    1) lift up on toes, then back down, start with 5x per day and work up to 20x   2) stand and lift one leg straight out to the side so that foot is a few inches of the floor, start with 5x each side and work up to 20x each side   3) stand on one foot, start with 5 seconds each side and work up to 20 seconds on each side  If you need ideas or help  with getting more active:  -Silver sneakers https://tools.silversneakers.com  -Walk with a Doc: http://www.duncan-williams.com/  -try to include resistance (weight lifting/strength building) and balance exercises twice per week: or the following link for ideas: http://castillo-powell.com/  BuyDucts.dk  STRESS MANAGEMENT: -can try meditating, or just sitting quietly with deep breathing while intentionally relaxing all parts of your body for 5 minutes daily -if you need further help with stress, anxiety or depression please follow up with your primary doctor or contact the wonderful folks at [facility]: [phone]    FOR IMPROVED SLEEP AND TO RESET YOUR SLEEP SCHEDULE:  []  Schedule sleep counseling(cognitive behavioral therapy).   [facility] is a good option.   Call for appointment: [phone]  []  Exercise 30 minutes daily. Some people do better with exercise in the morning, other do better exercising later in the day.  []  Avoid caffeine and alcohol - particularly in the evenings. For some people even a little alcohol can impair sleep.   []  Go to bed and wake up at the same time everyday (within a 30 minute window). When you get up in the morning for your designated wake time - turn on lights and open curtains right away. This will help to set your internal sleep clock.   []  Keep bedroom cool, dark and quiet - if you have to get up at night make sure there is no white light from street lamps, night lights, lights, clock, devices etc. that enters your eyes. Instead, use red light flashlight or night lights and avoid looking directly at the light while up.   []  Set 1-2 hour bedtime routine, dim lights, avoid screens (phones, computers, TVs, etc during this time), consider sleepytime tea, warm bath or shower, avoid alcohol and caffeine  []  Reserve bed for sleep - do not  read, watch TV, look at phone or device, etc., in bed.  []  If you toss and turn for more then 10-15 minutes, get out of bed and list or journal thoughts or do quiet activity (not screen-time, no tv, phone, computer) then go back to bed. Repeat as needed. Try not to worry about when you will eventually fall asleep.  []  Some people find that a half dose of benadryl, melatonin, tylenol  pm or unisom on a few nights per week is helpful initially for a few weeks.  []  Seek help for any depression or anxiety.  [] Prescription strength sleep medications should only be used in severe cases of insomnia if other measures fail and should be used sparingly.  I hope you are feeling better soon! Follow up with your doctor in 3-4 weeks or sooner if your symptoms worsen or new concerns arise.          Chiquita JONELLE Cramp, DO

## 2024-05-02 NOTE — Patient Instructions (Addendum)
 I really enjoyed getting to talk with you today! I am available on Tuesdays and Thursdays for virtual visits if you have any questions or concerns, or if I can be of any further assistance.   CHECKLIST FROM ANNUAL WELLNESS VISIT:  -Follow up (please call to schedule if not scheduled after visit):   -schedule your follow up in the office with Dr. Micheal sometime in the next 1-2 months.    -yearly for annual wellness visit with primary care office  Here is a list of your preventive care/health maintenance measures and the plan for each if any are due:  PLAN For any measures below that may be due:     1. Can get the Tdap vaccine and the flu and covid vaccines in the fall at the pharmacy. Please provide proof of receipt when you do so that we can update your records here. Thanks!   2. Schedule in office follow up for your foot exam, labs and diabetic kidney exam.   3. Call you eye doctor to set up a diabetic eye exam. Please request a copy of the report to give to Dr. Micheal. Thanks!  Health Maintenance  Topic Date Due   Diabetic kidney evaluation - Urine ACR  08/17/2014   FOOT EXAM  10/23/2017   DTaP/Tdap/Td (2 - Tdap) 04/25/2020   COVID-19 Vaccine (4 - 2024-25 season) 06/06/2023   OPHTHALMOLOGY EXAM  07/14/2023   Diabetic kidney evaluation - eGFR measurement  03/21/2024   INFLUENZA VACCINE  05/05/2024   HEMOGLOBIN A1C  05/14/2024   Medicare Annual Wellness (AWV)  05/02/2025   Colonoscopy  03/07/2034   Pneumococcal Vaccine: 50+ Years  Completed   Hepatitis C Screening  Completed   Zoster Vaccines- Shingrix   Completed   Hepatitis B Vaccines  Aged Out   HPV VACCINES  Aged Out   Meningococcal B Vaccine  Aged Out    -See a dentist at least yearly  -Get your eyes checked and then per your eye specialist's recommendations  -Other issues addressed today:   -I have included below further information regarding a healthy whole foods based diet, physical activity guidelines  for adults, stress management and opportunities for social connections. I hope you find this information useful.   -----------------------------------------------------------------------------------------------------------------------------------------------------------------------------------------------------------------------------------------------------------    NUTRITION: -eat real food: lots of colorful vegetables (half the plate) and fruits -5-7 servings of vegetables and fruits per day (fresh or steamed is best), exp. 2 servings of vegetables with lunch and dinner and 2 servings of fruit per day. Berries and greens such as kale and collards are great choices.  -consume on a regular basis:  fresh fruits, fresh veggies, fish, nuts, seeds, healthy oils (such as olive oil, avocado oil), whole grains (make sure for bread/pasta/crackers/etc., that the first ingredient on label contains the word whole), legumes. -can eat small amounts of dairy and lean meat (no larger than the palm of your hand), but avoid processed meats such as ham, bacon, lunch meat, etc. -drink water -try to avoid fast food and pre-packaged foods, processed meat, ultra processed foods/beverages (donuts, candy, etc.) -most experts advise limiting sodium to < 2300mg  per day, should limit further is any chronic conditions such as high blood pressure, heart disease, diabetes, etc. The American Heart Association advised that < 1500mg  is is ideal -try to avoid foods/beverages that contain any ingredients with names you do not recognize  -try to avoid foods/beverages  with added sugar or sweeteners/sweets  -try to avoid sweet drinks (including diet drinks): soda, juice,  Gatorade, sweet tea, power drinks, diet drinks -try to avoid white rice, white bread, pasta (unless whole grain)  EXERCISE GUIDELINES FOR ADULTS: -if you wish to increase your physical activity, do so gradually and with the approval of your doctor -STOP and seek  medical care immediately if you have any chest pain, chest discomfort or trouble breathing when starting or increasing exercise  -move and stretch your body, legs, feet and arms when sitting for long periods -Physical activity guidelines for optimal health in adults: -get at least 150 minutes per week of moderate exercise (can talk, but not sing); this is about 20-30 minutes of sustained activity 5-7 days per week or two 10-15 minute episodes of sustained activity 5-7 days per week -do some muscle building/resistance training/strength training at least 2 days per week  -balance exercises 3+ days per week:   Stand somewhere where you have something sturdy to hold onto if you lose balance    1) lift up on toes, then back down, start with 5x per day and work up to 20x   2) stand and lift one leg straight out to the side so that foot is a few inches of the floor, start with 5x each side and work up to 20x each side   3) stand on one foot, start with 5 seconds each side and work up to 20 seconds on each side  If you need ideas or help with getting more active:  -Silver sneakers https://tools.silversneakers.com  -Walk with a Doc: http://www.duncan-williams.com/  -try to include resistance (weight lifting/strength building) and balance exercises twice per week: or the following link for ideas: http://castillo-powell.com/  BuyDucts.dk  STRESS MANAGEMENT: -can try meditating, or just sitting quietly with deep breathing while intentionally relaxing all parts of your body for 5 minutes daily -if you need further help with stress, anxiety or depression please follow up with your primary doctor or contact the wonderful folks at [facility]: [phone]    FOR IMPROVED SLEEP AND TO RESET YOUR SLEEP SCHEDULE:  []  Schedule sleep counseling(cognitive behavioral therapy).   [facility] is a  good option.   Call for appointment: [phone]  []  Exercise 30 minutes daily. Some people do better with exercise in the morning, other do better exercising later in the day.  []  Avoid caffeine and alcohol - particularly in the evenings. For some people even a little alcohol can impair sleep.   []  Go to bed and wake up at the same time everyday (within a 30 minute window). When you get up in the morning for your designated wake time - turn on lights and open curtains right away. This will help to set your internal sleep clock.   []  Keep bedroom cool, dark and quiet - if you have to get up at night make sure there is no white light from street lamps, night lights, lights, clock, devices etc. that enters your eyes. Instead, use red light flashlight or night lights and avoid looking directly at the light while up.   []  Set 1-2 hour bedtime routine, dim lights, avoid screens (phones, computers, TVs, etc during this time), consider sleepytime tea, warm bath or shower, avoid alcohol and caffeine  []  Reserve bed for sleep - do not read, watch TV, look at phone or device, etc., in bed.  []  If you toss and turn for more then 10-15 minutes, get out of bed and list or journal thoughts or do quiet activity (not screen-time, no tv, phone, computer) then  go back to bed. Repeat as needed. Try not to worry about when you will eventually fall asleep.  []  Some people find that a half dose of benadryl, melatonin, tylenol  pm or unisom on a few nights per week is helpful initially for a few weeks.  []  Seek help for any depression or anxiety.  [] Prescription strength sleep medications should only be used in severe cases of insomnia if other measures fail and should be used sparingly.  I hope you are feeling better soon! Follow up with your doctor in 3-4 weeks or sooner if your symptoms worsen or new concerns arise.

## 2024-05-03 DIAGNOSIS — M1711 Unilateral primary osteoarthritis, right knee: Secondary | ICD-10-CM | POA: Diagnosis not present

## 2024-05-09 DIAGNOSIS — G8918 Other acute postprocedural pain: Secondary | ICD-10-CM | POA: Diagnosis not present

## 2024-05-09 DIAGNOSIS — S83231A Complex tear of medial meniscus, current injury, right knee, initial encounter: Secondary | ICD-10-CM | POA: Diagnosis not present

## 2024-05-09 DIAGNOSIS — M94261 Chondromalacia, right knee: Secondary | ICD-10-CM | POA: Diagnosis not present

## 2024-05-22 DIAGNOSIS — M25561 Pain in right knee: Secondary | ICD-10-CM | POA: Diagnosis not present

## 2024-05-26 DIAGNOSIS — M25561 Pain in right knee: Secondary | ICD-10-CM | POA: Diagnosis not present

## 2024-05-29 DIAGNOSIS — M25561 Pain in right knee: Secondary | ICD-10-CM | POA: Diagnosis not present

## 2024-05-31 DIAGNOSIS — M25561 Pain in right knee: Secondary | ICD-10-CM | POA: Diagnosis not present

## 2024-06-02 DIAGNOSIS — M25562 Pain in left knee: Secondary | ICD-10-CM | POA: Diagnosis not present

## 2024-06-19 DIAGNOSIS — M25561 Pain in right knee: Secondary | ICD-10-CM | POA: Diagnosis not present

## 2024-06-20 DIAGNOSIS — H40012 Open angle with borderline findings, low risk, left eye: Secondary | ICD-10-CM | POA: Diagnosis not present

## 2024-06-20 DIAGNOSIS — H2513 Age-related nuclear cataract, bilateral: Secondary | ICD-10-CM | POA: Diagnosis not present

## 2024-06-20 DIAGNOSIS — E119 Type 2 diabetes mellitus without complications: Secondary | ICD-10-CM | POA: Diagnosis not present

## 2024-06-20 LAB — OPHTHALMOLOGY REPORT-SCANNED

## 2024-06-21 DIAGNOSIS — M25561 Pain in right knee: Secondary | ICD-10-CM | POA: Diagnosis not present

## 2024-06-22 ENCOUNTER — Encounter: Payer: Self-pay | Admitting: Family Medicine

## 2024-06-22 NOTE — Telephone Encounter (Signed)
 Ok to refill the glipizide  for him -- we can also give him samples of the jardiance  for the next month. We have a patient assistance program to assist with cost for farxiga but not jardiance  so Dr. Micheal would have to consult Jon and switch him to comoros. I would have him follow up with Dr Micheal to discuss this.

## 2024-06-23 MED ORDER — GLIPIZIDE 10 MG PO TABS: 10.0000 mg | ORAL_TABLET | Freq: Two times a day (BID) | ORAL | 0 refills | Status: DC

## 2024-06-23 MED ORDER — EMPAGLIFLOZIN 10 MG PO TABS: 10.0000 mg | ORAL_TABLET | Freq: Every day | ORAL | Status: DC

## 2024-06-26 DIAGNOSIS — M25561 Pain in right knee: Secondary | ICD-10-CM | POA: Diagnosis not present

## 2024-06-27 DIAGNOSIS — K5120 Ulcerative (chronic) proctitis without complications: Secondary | ICD-10-CM | POA: Diagnosis not present

## 2024-06-28 DIAGNOSIS — M25561 Pain in right knee: Secondary | ICD-10-CM | POA: Diagnosis not present

## 2024-07-03 DIAGNOSIS — M25561 Pain in right knee: Secondary | ICD-10-CM | POA: Diagnosis not present

## 2024-07-04 ENCOUNTER — Telehealth: Payer: Self-pay

## 2024-07-04 MED ORDER — LEVOTHYROXINE SODIUM 112 MCG PO TABS: 112.0000 ug | ORAL_TABLET | Freq: Every day | ORAL | 0 refills | Status: DC

## 2024-07-04 NOTE — Telephone Encounter (Signed)
-----   Message from [name] sent at 07/04/2024 10:32 AM EDT ----- Patient requesting refill on the following medication:  Levothyroxine  112 mcg 1 tablet daily  Pharmacy Info: [id number] - [place], [place] - [address] (Ph: [phone])  Has follow up with PCP scheduled for 07/11/24  Thank you, [name], PharmD Clinical Pharmacist [phone]

## 2024-07-04 NOTE — Progress Notes (Signed)
   07/04/2024  Patient ID: John Frye, male   DOB: Jun 08, 1956, 68 y.o.   MRN: [id number]  Pharmacy Quality Measure Review  Statin Use in Persons with Diabetes (SUPD)  Patient is appearing on list for at risk to fail SUPD. Has declined statin use in the past. Contacted patient via telephone to discuss this and to screen for PAP for Jardiance /Farxiga.  Patient does not qualify for Jardiance  or Farxiga PAP due to income exceeding program limits. Did discuss that Medicare Open Enrollment starts on 07/19/24, will send mychart message to patient with resources for selecting the best plan for him in 2026.   Did discuss benefits of statin use with patient.  The 10-year ASCVD risk score (Arnett DK, et al., 2019) is: 24.6%   Values used to calculate the score:     Age: 73 years     Clincally relevant sex: Male     Is Non-Hispanic African American: No     Diabetic: Yes     Tobacco smoker: No     Systolic Blood Pressure: 134 mmHg     Is BP treated: No     HDL Cholesterol: 50.2 mg/dL     Total Cholesterol: 177 mg/dL  His ASCVD risk score is quite high and would indicate need for a high intensity statin, such as crestor or lipitor for patient. Patient plans to discuss with PCP, will also provide names of medication to patient in requested mychart message.   Patient requesting refill on levothyroxine , will coordinate with PCP team.  Follow Up with PCP: 07/11/24  [name], PharmD Clinical Pharmacist [phone]

## 2024-07-05 DIAGNOSIS — M25561 Pain in right knee: Secondary | ICD-10-CM | POA: Diagnosis not present

## 2024-07-08 ENCOUNTER — Other Ambulatory Visit: Payer: Self-pay | Admitting: Family Medicine

## 2024-07-11 ENCOUNTER — Ambulatory Visit: Payer: Self-pay | Admitting: Family Medicine

## 2024-07-11 ENCOUNTER — Ambulatory Visit: Admitting: Family Medicine

## 2024-07-11 ENCOUNTER — Encounter: Payer: Self-pay | Admitting: Family Medicine

## 2024-07-11 VITALS — BP 112/60 | HR 73 | Temp 98.0°F | Wt 249.6 lb

## 2024-07-11 DIAGNOSIS — E119 Type 2 diabetes mellitus without complications: Secondary | ICD-10-CM | POA: Diagnosis not present

## 2024-07-11 DIAGNOSIS — E038 Other specified hypothyroidism: Secondary | ICD-10-CM

## 2024-07-11 DIAGNOSIS — E785 Hyperlipidemia, unspecified: Secondary | ICD-10-CM | POA: Diagnosis not present

## 2024-07-11 DIAGNOSIS — Z7984 Long term (current) use of oral hypoglycemic drugs: Secondary | ICD-10-CM | POA: Diagnosis not present

## 2024-07-11 DIAGNOSIS — Z23 Encounter for immunization: Secondary | ICD-10-CM

## 2024-07-11 LAB — HEPATIC FUNCTION PANEL
ALT: 16 U/L (ref 0–53)
AST: 16 U/L (ref 0–37)
Albumin: 4.5 g/dL (ref 3.5–5.2)
Alkaline Phosphatase: 53 U/L (ref 39–117)
Bilirubin, Direct: 0.1 mg/dL (ref 0.0–0.3)
Total Bilirubin: 0.6 mg/dL (ref 0.2–1.2)
Total Protein: 6.7 g/dL (ref 6.0–8.3)

## 2024-07-11 LAB — POCT GLYCOSYLATED HEMOGLOBIN (HGB A1C): Hemoglobin A1C: 6.7 % — AB (ref 4.0–5.6)

## 2024-07-11 LAB — BASIC METABOLIC PANEL WITH GFR
BUN: 17 mg/dL (ref 6–23)
CO2: 29 meq/L (ref 19–32)
Calcium: 9.5 mg/dL (ref 8.4–10.5)
Chloride: 103 meq/L (ref 96–112)
Creatinine, Ser: 0.65 mg/dL (ref 0.40–1.50)
GFR: 98.34 mL/min (ref >60.00)
Glucose, Bld: 121 mg/dL — ABNORMAL HIGH (ref 70–99)
Potassium: 4.1 meq/L (ref 3.5–5.1)
Sodium: 139 meq/L (ref 135–145)

## 2024-07-11 LAB — TSH: TSH: 3.24 u[IU]/mL (ref 0.35–5.50)

## 2024-07-11 LAB — HEMOGLOBIN A1C: Hgb A1c MFr Bld: 6.8 % — ABNORMAL HIGH (ref 4.6–6.5)

## 2024-07-11 LAB — LIPID PANEL
Cholesterol: 181 mg/dL (ref 0–200)
HDL: 50.1 mg/dL (ref >39.00)
LDL Cholesterol: 106 mg/dL — ABNORMAL HIGH (ref 0–99)
NonHDL: 130.82
Total CHOL/HDL Ratio: 4
Triglycerides: 124 mg/dL (ref 0.0–149.0)
VLDL: 24.8 mg/dL (ref 0.0–40.0)

## 2024-07-11 LAB — MICROALBUMIN / CREATININE URINE RATIO
Creatinine,U: 66.5 mg/dL
Microalb Creat Ratio: UNDETERMINED mg/g (ref 0.0–30.0)
Microalb, Ur: <0.7 mg/dL

## 2024-07-11 NOTE — Progress Notes (Signed)
 Established Patient Office Visit  Subjective   Patient ID: John Frye, male    DOB: September 15, 1956  Age: 68 y.o. MRN: [id number]  Chief Complaint  Patient presents with   Medical Management of Chronic Issues    HPI   John Frye is seen today for medical follow-up.  He has history of obstructive sleep apnea, ulcerative colitis, type 2 diabetes, hypothyroidism, osteoarthritis, mild hyperlipidemia.  Currently not taking a statin.  He has been somewhat reluctant to add a statin in the past.  Coronary calcium score 2024 of 73 which was 50th percentile for age and gender.  He currently takes Jardiance , glipizide , and metformin  for diabetes.  He does bring up issue of cost with Jardiance  and knows there is no generic at this time.  May have to look at alternatives soon.  He has hypothyroidism treated with levothyroxine  112 mcg daily and compliant with therapy.  Rare hypoglycemic episode with glipizide .  Currently taking 5 mg daily.  Had previously been written for 10 mg twice daily after his blood sugar shot up after steroid injection.  He had repeat right knee arthroscopy 2 months ago and still has some soreness but overall some improved.  Past Medical History:  Diagnosis Date   ALLERGIC RHINITIS 05/12/2008   BACK PAIN 05/08/2008   Dermatophytosis of nail 05/08/2008   GRAVES DISEASE 05/08/2008   HYPOTHYROIDISM 10/10/2009   INSOMNIA 06/17/2010   OBSTRUCTIVE SLEEP APNEA 05/08/2008   PLANTAR FASCIITIS 05/08/2008   PREDIABETES 09/18/2010   SHOULDER PAIN, BILATERAL 10/10/2009   Past Surgical History:  Procedure Laterality Date   CYST REMOVAL LEG     Right toe   TONSILLECTOMY      reports that he has never smoked. He has never used smokeless tobacco. He reports current alcohol use of about 4.0 standard drinks of alcohol per week. He reports that he does not use drugs. family history includes Alcohol abuse in his father; Stroke (age of onset: 34) in his father. No Known Allergies  Review of Systems   Constitutional:  Negative for malaise/fatigue.  Eyes:  Negative for blurred vision.  Respiratory:  Negative for shortness of breath.   Cardiovascular:  Negative for chest pain.  Neurological:  Negative for dizziness, weakness and headaches.      Objective:     BP 112/60   Pulse 73   Temp 98 F (36.7 C) (Oral)   Wt 249 lb 9.6 oz (113.2 kg)   SpO2 97%   BMI 31.69 kg/m  BP Readings from Last 3 Encounters:  07/11/24 112/60  11/15/23 122/70  04/27/23 112/70   Wt Readings from Last 3 Encounters:  07/11/24 249 lb 9.6 oz (113.2 kg)  11/15/23 255 lb 3.2 oz (115.8 kg)  04/27/23 234 lb 4.8 oz (106.3 kg)      Physical Exam Vitals reviewed.  Constitutional:      Appearance: He is well-developed.  Eyes:     Pupils: Pupils are equal, round, and reactive to light.  Neck:     Thyroid : No thyromegaly.  Cardiovascular:     Rate and Rhythm: Normal rate and regular rhythm.  Pulmonary:     Effort: Pulmonary effort is normal. No respiratory distress.  Neurological:     Mental Status: He is alert and oriented to person, place, and time.    The 10-year ASCVD risk score (Arnett DK, et al., 2019) is: 18.6%   Values used to calculate the score:     Age: 68 years     Clincally  relevant sex: Male     Is Non-Hispanic African American: No     Diabetic: Yes     Tobacco smoker: No     Systolic Blood Pressure: 112 mmHg     Is BP treated: No     HDL Cholesterol: 50.2 mg/dL     Total Cholesterol: 177 mg/dL    Results for orders placed or performed in visit on 07/11/24  POC HgB A1c  Result Value Ref Range   Hemoglobin A1C 6.7 (A) 4.0 - 5.6 %   HbA1c POC (<> result, manual entry)     HbA1c, POC (prediabetic range)     HbA1c, POC (controlled diabetic range)        The 10-year ASCVD risk score (Arnett DK, et al., 2019) is: 18.6%    Assessment & Plan:   Problem List Items Addressed This Visit       Unprioritized   Hypothyroidism   Relevant Orders   TSH   Type 2 diabetes  mellitus, controlled (HCC)   Relevant Medications   glipiZIDE  (GLUCOTROL ) 5 MG tablet   Other Visit Diagnoses       Controlled type 2 diabetes mellitus without complication, without long-term current use of insulin (HCC)    -  Primary   Relevant Medications   glipiZIDE  (GLUCOTROL ) 5 MG tablet   Other Relevant Orders   POC HgB A1c (Completed)   Microalbumin / creatinine urine ratio     Hyperlipidemia, unspecified hyperlipidemia type       Relevant Orders   Lipid panel   Hepatic function panel   Basic metabolic panel with GFR     Need for influenza vaccination       Relevant Orders   Flu vaccine HIGH DOSE PF(Fluzone Trivalent) (Completed)     Type 2 diabetes controlled with A1c 6.7%.  He did bring up issue of possible alternatives to Jardiance .  Could potentially go with medication such as Actos would have to watch for any edema issues.  Prefer to continue Jardiance  if possible given cardiovascular protection and renal benefits  -Influenza vaccine given  -Check multiple labs as above.  Overdue for TSH and urine microalbumin screen.  -We discussed consideration for statin but will get lipids back first.  Calculated 10-year risk for CAD 18.6%.  Discussed ADA guidelines regarding statins and type II diabetics and he will consider  Return in about 6 months (around 01/09/2025).    Wolm Scarlet, MD

## 2024-07-12 DIAGNOSIS — M25561 Pain in right knee: Secondary | ICD-10-CM | POA: Diagnosis not present

## 2024-07-12 MED ORDER — ROSUVASTATIN CALCIUM 10 MG PO TABS: 10.0000 mg | ORAL_TABLET | Freq: Every day | ORAL | 0 refills | Status: DC

## 2024-07-15 ENCOUNTER — Other Ambulatory Visit: Payer: Self-pay | Admitting: Family Medicine

## 2024-07-19 DIAGNOSIS — M25561 Pain in right knee: Secondary | ICD-10-CM | POA: Diagnosis not present

## 2024-07-24 DIAGNOSIS — M25561 Pain in right knee: Secondary | ICD-10-CM | POA: Diagnosis not present

## 2024-07-27 ENCOUNTER — Encounter: Payer: Self-pay | Admitting: Family Medicine

## 2024-07-28 ENCOUNTER — Other Ambulatory Visit: Payer: Self-pay | Admitting: Family Medicine

## 2024-07-28 MED ORDER — GLIPIZIDE 5 MG PO TABS: 5.0000 mg | ORAL_TABLET | Freq: Two times a day (BID) | ORAL | 0 refills | Status: DC

## 2024-08-23 ENCOUNTER — Telehealth: Payer: Self-pay | Admitting: Family Medicine

## 2024-08-23 NOTE — Telephone Encounter (Signed)
 Copied from CRM [id number]. Topic: General - Call Back - No Documentation >> Aug 23, 2024 10:20 AM Rea BROCKS wrote: Reason for CRM: Patient's orthopedics office [facility]  are going to fax over a form for Dr. Micheal for knee replacement surgical clearance. Surgery date is 09/12/24 and they would like to get the form back asap to get things approved.

## 2024-08-24 NOTE — Telephone Encounter (Signed)
 Noted.  Will complete upon receiving.  Wolm LELON Scarlet MD [facility]

## 2024-08-29 ENCOUNTER — Other Ambulatory Visit: Payer: Self-pay | Admitting: Orthopaedic Surgery

## 2024-08-29 NOTE — Progress Notes (Signed)
 Sent message, via epic in basket, requesting orders in epic from Careers adviser.

## 2024-09-04 NOTE — Patient Instructions (Addendum)
 SURGICAL WAITING ROOM VISITATION  Patients having surgery or a procedure may have no more than 2 support people in the waiting area - these visitors may rotate.    Children under the age of 71 must have an adult with them who is not the patient.  Visitors with respiratory illnesses are discouraged from visiting and should remain at home.  If the patient needs to stay at the hospital during part of their recovery, the visitor guidelines for inpatient rooms apply. Pre-op nurse will coordinate an appropriate time for 1 support person to accompany patient in pre-op.  This support person may not rotate.    Please refer to the [facility] website for the visitor guidelines for Inpatients (after your surgery is over and you are in a regular room).       Your procedure is scheduled on: 09/12/24   Report to [facility] Main Entrance    Report to admitting at 5:15 AM   Call this number if you have problems the morning of surgery [phone]   Do not eat food :After Midnight.   After Midnight you may have the following liquids until 4:30 AM DAY OF SURGERY  Water Non-Citrus Juices (without pulp, NO RED-Apple, White grape, White cranberry) Black Coffee (NO MILK/CREAM OR CREAMERS, sugar ok)  Clear Tea (NO MILK/CREAM OR CREAMERS, sugar ok) regular and decaf                             Plain Jell-O (NO RED)                                           Fruit ices (not with fruit pulp, NO RED)                                     Popsicles (NO RED)                                                               Sports drinks like Gatorade (NO RED)                   The day of surgery:  Drink ONE (1) Pre-Surgery  G2 at 4:30 AM the morning of surgery. Drink in one sitting. Do not sip.  This drink was given to you during your hospital  pre-op appointment visit. Nothing else to drink after completing the  Pre-Surgery G2.    Oral Hygiene is also important to reduce your risk of infection.                                     Remember - BRUSH YOUR TEETH THE MORNING OF SURGERY WITH YOUR REGULAR TOOTHPASTE    Stop all vitamins and herbal supplements 7 days before surgery.   Take these medicines the morning of surgery with A SIP OF WATER: clonazepam , levothyroxine , rosuvastatin .  DO NOT TAKE ANY ORAL DIABETIC MEDICATIONS DAY OF YOUR SURGERY Hold Glipizide  and Metformin  the  morning of surgery.  Bring CPAP mask and tubing day of surgery.                              You may not have any metal on your body including hair pins, jewelry, and body piercing             Do not wear make-up, lotions, powders, perfumes/cologne, or deodorant              Men may shave face and neck.   Do not bring valuables to the hospital. [facility] IS NOT             RESPONSIBLE   FOR VALUABLES.   Contacts, glasses, dentures or bridgework may not be worn into surgery.   DO NOT BRING YOUR HOME MEDICATIONS TO THE HOSPITAL. PHARMACY WILL DISPENSE MEDICATIONS LISTED ON YOUR MEDICATION LIST TO YOU DURING YOUR ADMISSION IN THE HOSPITAL!    Patients discharged on the day of surgery will not be allowed to drive home.  Someone NEEDS to stay with you for the first 24 hours after anesthesia.   Special Instructions: Bring a copy of your healthcare power of attorney and living will documents the day of surgery if you haven't scanned them before.              Please read over the following fact sheets you were given: IF YOU HAVE QUESTIONS ABOUT YOUR PRE-OP INSTRUCTIONS PLEASE CALL [phone]   If you received a COVID test during your pre-op visit  it is requested that you wear a mask when out in public, stay away from anyone that may not be feeling well and notify your surgeon if you develop symptoms. If you test positive for Covid or have been in contact with anyone that has tested positive in the last 10 days please notify you surgeon.      Pre-operative 4 CHG Bath Instructions  DYNA-Hex 4 Chlorhexidine  Gluconate 4% Solution Antiseptic 4 fl. oz   You can play a key role in reducing the risk of infection after surgery. Your skin needs to be as free of germs as possible. You can reduce the number of germs on your skin by washing with CHG (chlorhexidine gluconate) soap before surgery. CHG is an antiseptic soap that kills germs and continues to kill germs even after washing.   DO NOT use if you have an allergy to chlorhexidine/CHG or antibacterial soaps. If your skin becomes reddened or irritated, stop using the CHG and notify one of our RNs at   Please shower with the CHG soap starting 4 days before surgery using the following schedule:     Please keep in mind the following:  DO NOT shave, including legs and underarms, starting the day of your first shower.   You may shave your face at any point before/day of surgery.  Place clean sheets on your bed the day you start using CHG soap. Use a clean washcloth (not used since being washed) for each shower. DO NOT sleep with pets once you start using the CHG.  CHG Shower Instructions:  If you choose to wash your hair and private area, wash first with your normal shampoo/soap.  After you use shampoo/soap, rinse your hair and body thoroughly to remove shampoo/soap residue.  Turn the water OFF and apply about 3 tablespoons (45 ml) of CHG soap to a CLEAN washcloth.  Apply CHG soap ONLY FROM YOUR  NECK DOWN TO YOUR TOES (washing for 3-5 minutes)  DO NOT use CHG soap on face, private areas, open wounds, or sores.  Pay special attention to the area where your surgery is being performed.  If you are having back surgery, having someone wash your back for you may be helpful. Wait 2 minutes after CHG soap is applied, then you may rinse off the CHG soap.  Pat dry with a clean towel  Put on clean clothes/pajamas   If you choose to wear lotion, please use ONLY the CHG-compatible lotions on the back of this paper.     Additional instructions for the day of  surgery: DO NOT APPLY any lotions, deodorants, cologne, or perfumes.   Put on clean/comfortable clothes.  Brush your teeth.  Ask your nurse before applying any prescription medications to the skin.   CHG Compatible Lotions   Aveeno Moisturizing lotion  Cetaphil Moisturizing Cream  Cetaphil Moisturizing Lotion  Clairol Herbal Essence Moisturizing Lotion, Dry Skin  Clairol Herbal Essence Moisturizing Lotion, Extra Dry Skin  Clairol Herbal Essence Moisturizing Lotion, Normal Skin  Curel Age Defying Therapeutic Moisturizing Lotion with Alpha Hydroxy  Curel Extreme Care Body Lotion  Curel Soothing Hands Moisturizing Hand Lotion  Curel Therapeutic Moisturizing Cream, Fragrance-Free  Curel Therapeutic Moisturizing Lotion, Fragrance-Free  Curel Therapeutic Moisturizing Lotion, Original Formula  Eucerin Daily Replenishing Lotion  Eucerin Dry Skin Therapy Plus Alpha Hydroxy Crme  Eucerin Dry Skin Therapy Plus Alpha Hydroxy Lotion  Eucerin Original Crme  Eucerin Original Lotion  Eucerin Plus Crme Eucerin Plus Lotion  Eucerin TriLipid Replenishing Lotion  Keri Anti-Bacterial Hand Lotion  Keri Deep Conditioning Original Lotion Dry Skin Formula Softly Scented  Keri Deep Conditioning Original Lotion, Fragrance Free Sensitive Skin Formula  Keri Lotion Fast Absorbing Fragrance Free Sensitive Skin Formula  Keri Lotion Fast Absorbing Softly Scented Dry Skin Formula  Keri Original Lotion  Keri Skin Renewal Lotion Keri Silky Smooth Lotion  Keri Silky Smooth Sensitive Skin Lotion  Nivea Body Creamy Conditioning Oil  Nivea Body Extra Enriched Lotion  Nivea Body Original Lotion  Nivea Body Sheer Moisturizing Lotion Nivea Crme  Nivea Skin Firming Lotion  NutraDerm 30 Skin Lotion  NutraDerm Skin Lotion  NutraDerm Therapeutic Skin Cream  NutraDerm Therapeutic Skin Lotion  ProShield Protective Hand Cream  Incentive Spirometer  An incentive spirometer is a tool that can help keep your lungs  clear and active. This tool measures how well you are filling your lungs with each breath. Taking long deep breaths may help reverse or decrease the chance of developing breathing (pulmonary) problems (especially infection) following: A long period of time when you are unable to move or be active. BEFORE THE PROCEDURE  If the spirometer includes an indicator to show your best effort, your nurse or respiratory therapist will set it to a desired goal. If possible, sit up straight or lean slightly forward. Try not to slouch. Hold the incentive spirometer in an upright position. INSTRUCTIONS FOR USE  Sit on the edge of your bed if possible, or sit up as far as you can in bed or on a chair. Hold the incentive spirometer in an upright position. Breathe out normally. Place the mouthpiece in your mouth and seal your lips tightly around it. Breathe in slowly and as deeply as possible, raising the piston or the ball toward the top of the column. Hold your breath for 3-5 seconds or for as long as possible. Allow the piston or ball to fall to the bottom  of the column. Remove the mouthpiece from your mouth and breathe out normally. Rest for a few seconds and repeat Steps 1 through 7 at least 10 times every 1-2 hours when you are awake. Take your time and take a few normal breaths between deep breaths. The spirometer may include an indicator to show your best effort. Use the indicator as a goal to work toward during each repetition. After each set of 10 deep breaths, practice coughing to be sure your lungs are clear. If you have an incision (the cut made at the time of surgery), support your incision when coughing by placing a pillow or rolled up towels firmly against it. Once you are able to get out of bed, walk around indoors and cough well. You may stop using the incentive spirometer when instructed by your caregiver.  RISKS AND COMPLICATIONS Take your time so you do not get dizzy or light-headed. If you  are in pain, you may need to take or ask for pain medication before doing incentive spirometry. It is harder to take a deep breath if you are having pain. AFTER USE Rest and breathe slowly and easily. It can be helpful to keep track of a log of your progress. Your caregiver can provide you with a simple table to help with this. If you are using the spirometer at home, follow these instructions: SEEK MEDICAL CARE IF:  You are having difficultly using the spirometer. You have trouble using the spirometer as often as instructed. Your pain medication is not giving enough relief while using the spirometer. You develop fever of 100.5 F (38.1 C) or higher. SEEK IMMEDIATE MEDICAL CARE IF:  You cough up bloody sputum that had not been present before. You develop fever of 102 F (38.9 C) or greater. You develop worsening pain at or near the incision site. MAKE SURE YOU:  Understand these instructions. Will watch your condition. Will get help right away if you are not doing well or get worse.   SABRAHow to Manage Your Diabetes Before and After Surgery  Why is it important to control my blood sugar before and after surgery? Improving blood sugar levels before and after surgery helps healing and can limit problems. A way of improving blood sugar control is eating a healthy diet by:  Eating less sugar and carbohydrates  Increasing activity/exercise  Talking with your doctor about reaching your blood sugar goals High blood sugars (greater than 180 mg/dL) can raise your risk of infections and slow your recovery, so you will need to focus on controlling your diabetes during the weeks before surgery. Make sure that the doctor who takes care of your diabetes knows about your planned surgery including the date and location.  How do I manage my blood sugar before surgery? Check your blood sugar at least 4 times a day, starting 2 days before surgery, to make sure that the level is not too high or  low. Check your blood sugar the morning of your surgery when you wake up and every 2 hours until you get to the Short Stay unit. If your blood sugar is less than 70 mg/dL, you will need to treat for low blood sugar: Do not take insulin. Treat a low blood sugar (less than 70 mg/dL) with  cup of clear juice (cranberry or apple), 4 glucose tablets, OR glucose gel. Recheck blood sugar in 15 minutes after treatment (to make sure it is greater than 70 mg/dL). If your blood sugar is not greater than  70 mg/dL on recheck, call [phone] for further instructions. Report your blood sugar to the short stay nurse when you get to Short Stay.  If you are admitted to the hospital after surgery: Your blood sugar will be checked by the staff and you will probably be given insulin after surgery (instead of oral diabetes medicines) to make sure you have good blood sugar levels. The goal for blood sugar control after surgery is 80-180 mg/dL.   WHAT DO I DO ABOUT MY DIABETES MEDICATION?  Do not take oral diabetes medicines (pills) the morning of surgery.Hold Metformin  and Glipizide  the morning of surgery.   DO NOT TAKE THE FOLLOWING 7 DAYS PRIOR TO SURGERY: Ozempic, Wegovy, Rybelsus (Semaglutide), Byetta (exenatide), Bydureon (exenatide ER), Victoza, Saxenda (liraglutide), or Trulicity (dulaglutide) Mounjaro (Tirzepatide) Adlyxin (Lixisenatide), Polyethylene Glycol Loxenatide.   Patient Signature:  Date:   Nurse Signature:  Date:   Reviewed and Endorsed by [facility] Patient Education Committee, August 2015

## 2024-09-04 NOTE — Progress Notes (Signed)
 COVID Vaccine received:  []  No [x]  Yes Date of any COVID positive Test in last 90 days: no PCP - Dr. Wolm Scarlet Cardiologist - n/a  Chest x-ray -  EKG -   Stress Test -  ECHO -  Cardiac Cath -   Bowel Prep - [x]  No  []   Yes ______  Pacemaker / ICD device [x]  No []  Yes   Spinal Cord Stimulator:[]  No []  Yes       History of Sleep Apnea? []  No [x]  Yes   CPAP used?- [x]  No []  Yes    Does the patient monitor blood sugar?          []  No [x]  Yes  []  N/A  Patient has: []  NO Hx DM   []  Pre-DM                 []  DM1  [x]   DM2 Does patient have a [employer] or Dexacom? [x]  No []  Yes   Fasting Blood Sugar Ranges- 130-160 Checks Blood Sugar ___2-3__ times a day  GLP1 agonist / usual dose - no GLP1 instructions:  SGLT-2 inhibitors / usual dose - no SGLT-2 instructions:   Blood Thinner / Instructions:no Aspirin Instructions:no  Comments:   Activity level: Patient is able  to climb a flight of stairs without difficulty; [x]  No CP  [x]  No SOB,    Patient can perform ADLs without assistance.   Anesthesia review:   Patient denies shortness of breath, fever, cough and chest pain at PAT appointment.  Patient verbalized understanding and agreement to the Pre-Surgical Instructions that were given to them at this PAT appointment. Patient was also educated of the need to review these PAT instructions again prior to his/her surgery.I reviewed the appropriate phone numbers to call if they have any and questions or concerns.

## 2024-09-05 ENCOUNTER — Other Ambulatory Visit: Payer: Self-pay

## 2024-09-05 ENCOUNTER — Encounter (HOSPITAL_COMMUNITY): Payer: Self-pay

## 2024-09-05 ENCOUNTER — Encounter (HOSPITAL_COMMUNITY)
Admission: RE | Admit: 2024-09-05 | Discharge: 2024-09-05 | Disposition: A | Source: Ambulatory Visit | Attending: Orthopaedic Surgery

## 2024-09-05 ENCOUNTER — Ambulatory Visit (HOSPITAL_COMMUNITY)
Admission: RE | Admit: 2024-09-05 | Discharge: 2024-09-05 | Disposition: A | Source: Ambulatory Visit | Attending: Orthopaedic Surgery | Admitting: Orthopaedic Surgery

## 2024-09-05 VITALS — BP 135/81 | HR 69 | Temp 97.8°F | Resp 16 | Ht 74.0 in | Wt 259.0 lb

## 2024-09-05 DIAGNOSIS — Z01818 Encounter for other preprocedural examination: Secondary | ICD-10-CM | POA: Insufficient documentation

## 2024-09-05 DIAGNOSIS — E119 Type 2 diabetes mellitus without complications: Secondary | ICD-10-CM | POA: Insufficient documentation

## 2024-09-05 HISTORY — DX: Unspecified osteoarthritis, unspecified site: M19.90

## 2024-09-05 LAB — CBC
HCT: 42.3 % (ref 39.0–52.0)
Hemoglobin: 14.4 g/dL (ref 13.0–17.0)
MCH: 30.6 pg (ref 26.0–34.0)
MCHC: 34 g/dL (ref 30.0–36.0)
MCV: 89.8 fL (ref 80.0–100.0)
Platelets: 304 K/uL (ref 150–400)
RBC: 4.71 MIL/uL (ref 4.22–5.81)
RDW: 12.8 % (ref 11.5–15.5)
WBC: 7 K/uL (ref 4.0–10.5)
nRBC: 0 % (ref 0.0–0.2)

## 2024-09-05 LAB — BASIC METABOLIC PANEL WITH GFR
Anion gap: 7 (ref 5–15)
BUN: 16 mg/dL (ref 8–23)
CO2: 28 mmol/L (ref 22–32)
Calcium: 9.4 mg/dL (ref 8.9–10.3)
Chloride: 103 mmol/L (ref 98–111)
Creatinine, Ser: 0.7 mg/dL (ref 0.61–1.24)
GFR, Estimated: >60 mL/min (ref >60)
Glucose, Bld: 171 mg/dL — ABNORMAL HIGH (ref 70–99)
Potassium: 4.2 mmol/L (ref 3.5–5.1)
Sodium: 138 mmol/L (ref 135–145)

## 2024-09-05 LAB — SURGICAL PCR SCREEN
MRSA, PCR: NEGATIVE
Staphylococcus aureus: NEGATIVE

## 2024-09-05 LAB — GLUCOSE, CAPILLARY: Glucose-Capillary: 193 mg/dL — ABNORMAL HIGH (ref 70–99)

## 2024-09-11 ENCOUNTER — Encounter: Payer: Self-pay | Admitting: Family Medicine

## 2024-09-11 MED ORDER — LEVOTHYROXINE SODIUM 112 MCG PO TABS: 112.0000 ug | ORAL_TABLET | Freq: Every day | ORAL | 1 refills | Status: AC

## 2024-09-11 MED ORDER — TRANEXAMIC ACID 1000 MG/10ML IV SOLN
2000.0000 mg | INTRAVENOUS | Status: DC
  Filled 2024-09-11: qty 20

## 2024-09-11 MED ORDER — GLIPIZIDE 5 MG PO TABS: 5.0000 mg | ORAL_TABLET | Freq: Two times a day (BID) | ORAL | 1 refills | Status: AC

## 2024-09-11 NOTE — Anesthesia Preprocedure Evaluation (Signed)
 Anesthesia Evaluation  Patient identified by MRN, date of birth, ID band Patient awake    Reviewed: Allergy & Precautions, NPO status , Patient's Chart, lab work & pertinent test results  Airway Mallampati: II  TM Distance: >3 FB Neck ROM: Full    Dental  (+) Teeth Intact, Dental Advisory Given, Caps,    Pulmonary sleep apnea    Pulmonary exam normal breath sounds clear to auscultation       Cardiovascular negative cardio ROS Normal cardiovascular exam Rhythm:Regular Rate:Normal     Neuro/Psych negative neurological ROS     GI/Hepatic Neg liver ROS, PUD,,,  Endo/Other  diabetes, Type 2, Oral Hypoglycemic AgentsHypothyroidism  Obesity   Renal/GU negative Renal ROS     Musculoskeletal  (+) Arthritis ,  RIGHT KNEE DEGENERATIVE JOINT DISEASE   Abdominal   Peds  Hematology negative hematology ROS (+) Plt 304k   Anesthesia Other Findings   Reproductive/Obstetrics                              Anesthesia Physical Anesthesia Plan  ASA: 2  Anesthesia Plan: Spinal   Post-op Pain Management: Regional block*, Tylenol  PO (pre-op)* and Toradol  IV (intra-op)*   Induction: Intravenous  PONV Risk Score and Plan: 2 and Midazolam , TIVA, Dexamethasone  and Ondansetron   Airway Management Planned: Natural Airway and Simple Face Mask  Additional Equipment:   Intra-op Plan:   Post-operative Plan:   Informed Consent: I have reviewed the patients History and Physical, chart, labs and discussed the procedure including the risks, benefits and alternatives for the proposed anesthesia with the patient or authorized representative who has indicated his/her understanding and acceptance.     Dental advisory given  Plan Discussed with: CRNA  Anesthesia Plan Comments:          Anesthesia Quick Evaluation

## 2024-09-11 NOTE — H&P (Signed)
 TOTAL KNEE ADMISSION H&P  Patient is being admitted for right total knee arthroplasty.  Subjective:  Chief Complaint:right knee pain.  HPI: John Frye, 68 y.o. male, has a history of pain and functional disability in the right knee due to arthritis and has failed non-surgical conservative treatments for greater than 12 weeks to includeNSAID's and/or analgesics, corticosteriod injections, flexibility and strengthening excercises, supervised PT with diminished ADL's post treatment, use of assistive devices, weight reduction as appropriate, and activity modification.  Onset of symptoms was gradual, starting 5 years ago with gradually worsening course since that time. The patient noted no past surgery on the right knee(s).  Patient currently rates pain in the right knee(s) at 10 out of 10 with activity. Patient has night pain, worsening of pain with activity and weight bearing, pain that interferes with activities of daily living, crepitus, and joint swelling.  Patient has evidence of subchondral cysts, subchondral sclerosis, periarticular osteophytes, and joint space narrowing by imaging studies. There is no active infection.  Patient Active Problem List   Diagnosis Date Noted   Primary osteoarthritis of right hip 04/01/2018   Ulcerative colitis (HCC) 07/17/2016   Low testosterone  06/20/2015   Type 2 diabetes mellitus, controlled (HCC) 03/03/2013   INSOMNIA 06/17/2010   Hypothyroidism 10/10/2009   SHOULDER PAIN, BILATERAL 10/10/2009   ALLERGIC RHINITIS 05/12/2008   DERMATOPHYTOSIS OF NAIL 05/08/2008   GRAVES DISEASE 05/08/2008   Obstructive sleep apnea 05/08/2008   BACK PAIN 05/08/2008   PLANTAR FASCIITIS 05/08/2008   Past Medical History:  Diagnosis Date   ALLERGIC RHINITIS 05/12/2008   Arthritis    BACK PAIN 05/08/2008   Dermatophytosis of nail 05/08/2008   GRAVES DISEASE 05/08/2008   HYPOTHYROIDISM 10/10/2009   INSOMNIA 06/17/2010   OBSTRUCTIVE SLEEP APNEA 05/08/2008    PLANTAR FASCIITIS 05/08/2008   PREDIABETES 09/18/2010   SHOULDER PAIN, BILATERAL 10/10/2009    Past Surgical History:  Procedure Laterality Date   CYST REMOVAL LEG     Right toe   HIP ARTHROPLASTY Bilateral    KNEE ARTHROSCOPY Right    x2   TONSILLECTOMY      Current Facility-Administered Medications  Medication Dose Route Frequency Provider Last Rate Last Admin   [START ON 09/12/2024] tranexamic acid  (CYKLOKAPRON ) 2,000 mg in sodium chloride  0.9 % 50 mL Topical Application  2,000 mg Topical On Call to OR Sheril Coy, MD       Current Outpatient Medications  Medication Sig Dispense Refill Last Dose/Taking   clonazePAM  (KLONOPIN ) 0.5 MG tablet Take 1 tablet (0.5 mg total) by mouth at bedtime as needed. 90 tablet 0 Taking As Needed   cyclobenzaprine  (FLEXERIL ) 10 MG tablet Take 1 tablet (10 mg total) by mouth 3 (three) times daily as needed for muscle spasms. 60 tablet 0 Taking As Needed   metFORMIN  (GLUCOPHAGE ) 500 MG tablet Take 2 tablets (1,000 mg total) by mouth 2 (two) times daily with a meal. 360 tablet 1 Taking   rosuvastatin  (CRESTOR ) 10 MG tablet Take 1 tablet (10 mg total) by mouth daily. 90 tablet 0 Taking   vedolizumab  (ENTYVIO ) 300 MG injection Inject 300 mg into the vein every 8 (eight) weeks.   Taking   glipiZIDE  (GLUCOTROL ) 5 MG tablet Take 1 tablet (5 mg total) by mouth 2 (two) times daily before a meal. 90 tablet 1    levothyroxine  (SYNTHROID ) 112 MCG tablet Take 1 tablet (112 mcg total) by mouth daily before breakfast. 90 tablet 1    No Known Allergies  Social History  Tobacco Use   Smoking status: Never   Smokeless tobacco: Never  Substance Use Topics   Alcohol use: Yes    Alcohol/week: 4.0 standard drinks of alcohol    Types: 2 Cans of beer, 2 Glasses of wine per week    Comment: Per week    Family History  Problem Relation Age of Onset   Alcohol abuse Father    Stroke Father 33     Review of Systems  Musculoskeletal:  Positive for arthralgias.        Right knee pain  All other systems reviewed and are negative.   Objective:  Physical Exam Constitutional:      Appearance: Normal appearance.  HENT:     Head: Normocephalic and atraumatic.     Nose: Nose normal.     Mouth/Throat:     Pharynx: Oropharynx is clear.  Eyes:     Extraocular Movements: Extraocular movements intact.  Pulmonary:     Effort: Pulmonary effort is normal.  Abdominal:     Palpations: Abdomen is soft.  Musculoskeletal:     Cervical back: Normal range of motion.     Comments: Right knee motion is 0 to 120 degrees at which point he has pain. He has pain along the medial joint line. He does have trace effusion today. He has some benign scope portal scars. Calf is soft and nontender. Hip motion is painless.  Skin:    General: Skin is warm and dry.  Neurological:     General: No focal deficit present.     Mental Status: He is alert and oriented to person, place, and time. Mental status is at baseline.  Psychiatric:        Mood and Affect: Mood normal.        Behavior: Behavior normal.        Thought Content: Thought content normal.        Judgment: Judgment normal.     Vital signs in last 24 hours:    Labs:   Estimated body mass index is 33.25 kg/m as calculated from the following:   Height as of 09/05/24: 6' 2 (1.88 m).   Weight as of 09/05/24: 117.5 kg.   Imaging Review Plain radiographs demonstrate severe degenerative joint disease of the right knee(s). The overall alignment isneutral. The bone quality appears to be good for age and reported activity level.      Assessment/Plan:  End stage primary arthritis, right knee   The patient history, physical examination, clinical judgment of the provider and imaging studies are consistent with end stage degenerative joint disease of the right knee(s) and total knee arthroplasty is deemed medically necessary. The treatment options including medical management, injection therapy arthroscopy and  arthroplasty were discussed at length. The risks and benefits of total knee arthroplasty were presented and reviewed. The risks due to aseptic loosening, infection, stiffness, patella tracking problems, thromboembolic complications and other imponderables were discussed. The patient acknowledged the explanation, agreed to proceed with the plan and consent was signed. Patient is being admitted for inpatient treatment for surgery, pain control, PT, OT, prophylactic antibiotics, VTE prophylaxis, progressive ambulation and ADL's and discharge planning. The patient is planning to be discharged home with home health services   Patient's anticipated LOS is less than 2 midnights, meeting these requirements: - Younger than 103 - Lives within 1 hour of care - Has a competent adult at home to recover with post-op recover - NO history of  - Chronic pain requiring opiods  -  Diabetes  - Coronary Artery Disease  - Heart failure  - Heart attack  - Stroke  - DVT/VTE  - Cardiac arrhythmia  - Respiratory Failure/COPD  - Renal failure  - Anemia  - Advanced Liver disease

## 2024-09-12 ENCOUNTER — Ambulatory Visit (HOSPITAL_COMMUNITY)
Admission: RE | Admit: 2024-09-12 | Discharge: 2024-09-12 | Disposition: A | Attending: Orthopaedic Surgery | Admitting: Orthopaedic Surgery

## 2024-09-12 ENCOUNTER — Encounter (HOSPITAL_COMMUNITY): Payer: Self-pay | Admitting: Orthopaedic Surgery

## 2024-09-12 ENCOUNTER — Ambulatory Visit (HOSPITAL_COMMUNITY): Payer: Self-pay | Admitting: Medical

## 2024-09-12 ENCOUNTER — Ambulatory Visit (HOSPITAL_COMMUNITY): Admitting: Anesthesiology

## 2024-09-12 ENCOUNTER — Encounter (HOSPITAL_COMMUNITY): Admission: RE | Disposition: A | Payer: Self-pay | Source: Ambulatory Visit | Attending: Orthopaedic Surgery

## 2024-09-12 DIAGNOSIS — M1711 Unilateral primary osteoarthritis, right knee: Secondary | ICD-10-CM | POA: Diagnosis present

## 2024-09-12 HISTORY — PX: TOTAL KNEE ARTHROPLASTY: SHX125

## 2024-09-12 LAB — GLUCOSE, CAPILLARY
Glucose-Capillary: 164 mg/dL — ABNORMAL HIGH (ref 70–99)
Glucose-Capillary: 138 mg/dL — ABNORMAL HIGH (ref 70–99)
Glucose-Capillary: 104 mg/dL — ABNORMAL HIGH (ref 70–99)

## 2024-09-12 SURGERY — ARTHROPLASTY, KNEE, TOTAL
Anesthesia: Spinal | Site: Knee | Laterality: Right

## 2024-09-12 MED ORDER — FENTANYL CITRATE (PF) 50 MCG/ML IJ SOSY
PREFILLED_SYRINGE | INTRAMUSCULAR | Status: AC
  Filled 2024-09-12: qty 2

## 2024-09-12 MED ORDER — MIDAZOLAM HCL 2 MG/2ML IJ SOLN
INTRAMUSCULAR | Status: AC
  Filled 2024-09-12: qty 2

## 2024-09-12 MED ORDER — HYDROCODONE-ACETAMINOPHEN 7.5-325 MG PO TABS
ORAL_TABLET | ORAL | Status: AC
  Filled 2024-09-12: qty 2

## 2024-09-12 MED ORDER — BUPIVACAINE-EPINEPHRINE (PF) 0.5% -1:200000 IJ SOLN
INTRAMUSCULAR | Status: AC
  Filled 2024-09-12: qty 30

## 2024-09-12 MED ORDER — POVIDONE-IODINE 10 % EX SWAB: 2.0000 | Freq: Once | CUTANEOUS | Status: DC

## 2024-09-12 MED ORDER — BUPIVACAINE LIPOSOME 1.3 % IJ SUSP
INTRAMUSCULAR | Status: AC
  Filled 2024-09-12: qty 20

## 2024-09-12 MED ORDER — KETOROLAC TROMETHAMINE 15 MG/ML IJ SOLN
7.5000 mg | Freq: Four times a day (QID) | INTRAMUSCULAR | Status: DC
  Administered 2024-09-12: 7.5 mg via INTRAVENOUS

## 2024-09-12 MED ORDER — ONDANSETRON HCL 4 MG/2ML IJ SOLN
INTRAMUSCULAR | Status: AC
  Filled 2024-09-12: qty 2

## 2024-09-12 MED ORDER — BUPIVACAINE IN DEXTROSE 0.75-8.25 % IT SOLN
INTRATHECAL | Status: DC | PRN
  Administered 2024-09-12: 1.6 mL via INTRATHECAL

## 2024-09-12 MED ORDER — METHOCARBAMOL 500 MG PO TABS
ORAL_TABLET | ORAL | Status: AC
  Filled 2024-09-12: qty 1

## 2024-09-12 MED ORDER — METOCLOPRAMIDE HCL 5 MG PO TABS: 5.0000 mg | ORAL_TABLET | Freq: Three times a day (TID) | ORAL | Status: DC | PRN

## 2024-09-12 MED ORDER — HYDROCODONE-ACETAMINOPHEN 7.5-325 MG PO TABS
1.0000 | ORAL_TABLET | ORAL | Status: DC | PRN
  Administered 2024-09-12 (×3): 2 via ORAL

## 2024-09-12 MED ORDER — METHOCARBAMOL 1000 MG/10ML IJ SOLN: 500.0000 mg | Freq: Four times a day (QID) | INTRAMUSCULAR | Status: DC | PRN

## 2024-09-12 MED ORDER — STERILE WATER FOR IRRIGATION IR SOLN
Status: DC | PRN
  Administered 2024-09-12: 2000 mL

## 2024-09-12 MED ORDER — INSULIN ASPART 100 UNIT/ML IJ SOLN: 0.0000 [IU] | INTRAMUSCULAR | Status: DC | PRN

## 2024-09-12 MED ORDER — DEXAMETHASONE SOD PHOSPHATE PF 10 MG/ML IJ SOLN
INTRAMUSCULAR | Status: DC | PRN
  Administered 2024-09-12: 4 mg via INTRAVENOUS

## 2024-09-12 MED ORDER — CEFAZOLIN SODIUM-DEXTROSE 2-4 GM/100ML-% IV SOLN
INTRAVENOUS | Status: AC
  Filled 2024-09-12: qty 100

## 2024-09-12 MED ORDER — TIZANIDINE HCL 4 MG PO TABS: 4.0000 mg | ORAL_TABLET | Freq: Four times a day (QID) | ORAL | 1 refills | Status: AC | PRN

## 2024-09-12 MED ORDER — ONDANSETRON HCL 4 MG/2ML IJ SOLN
INTRAMUSCULAR | Status: DC | PRN
  Administered 2024-09-12: 4 mg via INTRAVENOUS

## 2024-09-12 MED ORDER — ACETAMINOPHEN 500 MG PO TABS
500.0000 mg | ORAL_TABLET | Freq: Four times a day (QID) | ORAL | Status: DC
  Administered 2024-09-12: 500 mg via ORAL

## 2024-09-12 MED ORDER — TRANEXAMIC ACID 1000 MG/10ML IV SOLN
INTRAVENOUS | Status: DC | PRN
  Administered 2024-09-12: 2000 mg via TOPICAL

## 2024-09-12 MED ORDER — LACTATED RINGERS IV SOLN: INTRAVENOUS | Status: DC

## 2024-09-12 MED ORDER — CEFAZOLIN SODIUM-DEXTROSE 2-4 GM/100ML-% IV SOLN
2.0000 g | INTRAVENOUS | Status: AC
  Administered 2024-09-12: 2 g via INTRAVENOUS
  Filled 2024-09-12: qty 100

## 2024-09-12 MED ORDER — ROPIVACAINE HCL 5 MG/ML IJ SOLN
INTRAMUSCULAR | Status: DC | PRN
  Administered 2024-09-12: 20 mL via PERINEURAL

## 2024-09-12 MED ORDER — TRANEXAMIC ACID-NACL 1000-0.7 MG/100ML-% IV SOLN
INTRAVENOUS | Status: AC
  Filled 2024-09-12: qty 100

## 2024-09-12 MED ORDER — CEFAZOLIN SODIUM-DEXTROSE 2-4 GM/100ML-% IV SOLN
2.0000 g | Freq: Four times a day (QID) | INTRAVENOUS | Status: DC
  Administered 2024-09-12: 2 g via INTRAVENOUS

## 2024-09-12 MED ORDER — LACTATED RINGERS IV BOLUS: 250.0000 mL | Freq: Once | INTRAVENOUS | Status: DC

## 2024-09-12 MED ORDER — PHENYLEPHRINE 80 MCG/ML (10ML) SYRINGE FOR IV PUSH (FOR BLOOD PRESSURE SUPPORT)
PREFILLED_SYRINGE | INTRAVENOUS | Status: AC
  Filled 2024-09-12: qty 10

## 2024-09-12 MED ORDER — FENTANYL CITRATE (PF) 50 MCG/ML IJ SOSY
25.0000 ug | PREFILLED_SYRINGE | INTRAMUSCULAR | Status: DC | PRN
  Administered 2024-09-12 (×2): 50 ug via INTRAVENOUS

## 2024-09-12 MED ORDER — FENTANYL CITRATE (PF) 100 MCG/2ML IJ SOLN
INTRAMUSCULAR | Status: AC
  Filled 2024-09-12: qty 2

## 2024-09-12 MED ORDER — ASPIRIN 81 MG PO TBEC: 81.0000 mg | DELAYED_RELEASE_TABLET | Freq: Two times a day (BID) | ORAL | 0 refills | Status: AC

## 2024-09-12 MED ORDER — ORAL CARE MOUTH RINSE: 15.0000 mL | Freq: Once | OROMUCOSAL | Status: AC

## 2024-09-12 MED ORDER — ACETAMINOPHEN 500 MG PO TABS
ORAL_TABLET | ORAL | Status: AC
  Filled 2024-09-12: qty 1

## 2024-09-12 MED ORDER — SODIUM CHLORIDE (PF) 0.9 % IJ SOLN
INTRAMUSCULAR | Status: AC
  Filled 2024-09-12: qty 30

## 2024-09-12 MED ORDER — ONDANSETRON HCL 4 MG PO TABS: 4.0000 mg | ORAL_TABLET | Freq: Four times a day (QID) | ORAL | Status: DC | PRN

## 2024-09-12 MED ORDER — MIDAZOLAM HCL (PF) 2 MG/2ML IJ SOLN
INTRAMUSCULAR | Status: DC | PRN
  Administered 2024-09-12: 2 mg via INTRAVENOUS

## 2024-09-12 MED ORDER — BUPIVACAINE LIPOSOME 1.3 % IJ SUSP: 20.0000 mL | Freq: Once | INTRAMUSCULAR | Status: DC

## 2024-09-12 MED ORDER — HYDROCODONE-ACETAMINOPHEN 5-325 MG PO TABS: 1.0000 | ORAL_TABLET | ORAL | Status: DC | PRN

## 2024-09-12 MED ORDER — CLONIDINE HCL (ANALGESIA) 100 MCG/ML EP SOLN
EPIDURAL | Status: DC | PRN
  Administered 2024-09-12: 50 ug

## 2024-09-12 MED ORDER — HYDROCODONE-ACETAMINOPHEN 5-325 MG PO TABS: 1.0000 | ORAL_TABLET | Freq: Four times a day (QID) | ORAL | 0 refills | Status: AC | PRN

## 2024-09-12 MED ORDER — FENTANYL CITRATE (PF) 50 MCG/ML IJ SOSY
PREFILLED_SYRINGE | INTRAMUSCULAR | Status: AC
  Filled 2024-09-12: qty 1

## 2024-09-12 MED ORDER — METHOCARBAMOL 500 MG PO TABS
500.0000 mg | ORAL_TABLET | Freq: Four times a day (QID) | ORAL | Status: DC | PRN
  Administered 2024-09-12: 500 mg via ORAL

## 2024-09-12 MED ORDER — KETOROLAC TROMETHAMINE 15 MG/ML IJ SOLN
INTRAMUSCULAR | Status: AC
  Filled 2024-09-12: qty 1

## 2024-09-12 MED ORDER — ONDANSETRON HCL 4 MG/2ML IJ SOLN: 4.0000 mg | Freq: Once | INTRAMUSCULAR | Status: DC | PRN

## 2024-09-12 MED ORDER — PROPOFOL 500 MG/50ML IV EMUL
INTRAVENOUS | Status: DC | PRN
  Administered 2024-09-12: 25 ug/kg/min via INTRAVENOUS

## 2024-09-12 MED ORDER — TRANEXAMIC ACID-NACL 1000-0.7 MG/100ML-% IV SOLN
1000.0000 mg | Freq: Once | INTRAVENOUS | Status: AC
  Administered 2024-09-12: 1000 mg via INTRAVENOUS

## 2024-09-12 MED ORDER — PROPOFOL 1000 MG/100ML IV EMUL
INTRAVENOUS | Status: AC
  Filled 2024-09-12: qty 100

## 2024-09-12 MED ORDER — SODIUM CHLORIDE (PF) 0.9 % IJ SOLN
INTRAMUSCULAR | Status: DC | PRN
  Administered 2024-09-12: 80 mL

## 2024-09-12 MED ORDER — AMISULPRIDE (ANTIEMETIC) 5 MG/2ML IV SOLN: 10.0000 mg | Freq: Once | INTRAVENOUS | Status: DC | PRN

## 2024-09-12 MED ORDER — PROPOFOL 10 MG/ML IV BOLUS
INTRAVENOUS | Status: AC
  Filled 2024-09-12: qty 20

## 2024-09-12 MED ORDER — METOCLOPRAMIDE HCL 5 MG/ML IJ SOLN: 5.0000 mg | Freq: Three times a day (TID) | INTRAMUSCULAR | Status: DC | PRN

## 2024-09-12 MED ORDER — CHLORHEXIDINE GLUCONATE 0.12 % MT SOLN
15.0000 mL | Freq: Once | OROMUCOSAL | Status: AC
  Administered 2024-09-12: 15 mL via OROMUCOSAL

## 2024-09-12 MED ORDER — ACETAMINOPHEN 500 MG PO TABS
1000.0000 mg | ORAL_TABLET | Freq: Once | ORAL | Status: AC
  Administered 2024-09-12: 1000 mg via ORAL
  Filled 2024-09-12: qty 2

## 2024-09-12 MED ORDER — TRANEXAMIC ACID-NACL 1000-0.7 MG/100ML-% IV SOLN
1000.0000 mg | INTRAVENOUS | Status: AC
  Administered 2024-09-12: 1000 mg via INTRAVENOUS
  Filled 2024-09-12: qty 100

## 2024-09-12 MED ORDER — LACTATED RINGERS IV BOLUS
500.0000 mL | Freq: Once | INTRAVENOUS | Status: AC
  Administered 2024-09-12: 500 mL via INTRAVENOUS

## 2024-09-12 MED ORDER — 0.9 % SODIUM CHLORIDE (POUR BTL) OPTIME
TOPICAL | Status: DC | PRN
  Administered 2024-09-12: 1000 mL

## 2024-09-12 MED ORDER — ONDANSETRON HCL 4 MG/2ML IJ SOLN: 4.0000 mg | Freq: Four times a day (QID) | INTRAMUSCULAR | Status: DC | PRN

## 2024-09-12 MED ORDER — ACETAMINOPHEN 325 MG PO TABS: 325.0000 mg | ORAL_TABLET | Freq: Four times a day (QID) | ORAL | Status: DC | PRN

## 2024-09-12 MED ORDER — PHENYLEPHRINE 80 MCG/ML (10ML) SYRINGE FOR IV PUSH (FOR BLOOD PRESSURE SUPPORT)
PREFILLED_SYRINGE | INTRAVENOUS | Status: DC | PRN
  Administered 2024-09-12: 80 ug via INTRAVENOUS

## 2024-09-12 MED ORDER — FENTANYL CITRATE (PF) 100 MCG/2ML IJ SOLN
INTRAMUSCULAR | Status: DC | PRN
  Administered 2024-09-12: 25 ug via INTRAVENOUS
  Administered 2024-09-12 (×2): 50 ug via INTRAVENOUS
  Administered 2024-09-12 (×3): 25 ug via INTRAVENOUS

## 2024-09-12 MED ORDER — MORPHINE SULFATE (PF) 2 MG/ML IV SOLN: 0.5000 mg | INTRAVENOUS | Status: DC | PRN

## 2024-09-12 MED ORDER — PROPOFOL 10 MG/ML IV BOLUS
INTRAVENOUS | Status: DC | PRN
  Administered 2024-09-12: 20 mg via INTRAVENOUS
  Administered 2024-09-12 (×2): 30 mg via INTRAVENOUS
  Administered 2024-09-12 (×2): 20 mg via INTRAVENOUS
  Administered 2024-09-12: 100 mg via INTRAVENOUS
  Administered 2024-09-12: 30 mg via INTRAVENOUS
  Administered 2024-09-12: 50 mg via INTRAVENOUS

## 2024-09-12 MED ORDER — INSULIN ASPART 100 UNIT/ML IJ SOLN
INTRAMUSCULAR | Status: AC
  Administered 2024-09-12: 2 [IU] via SUBCUTANEOUS
  Filled 2024-09-12: qty 2

## 2024-09-12 SURGICAL SUPPLY — 49 items
ATTUNE MED DOME PAT 41 KNEE (Knees) IMPLANT
ATTUNE PS FEM RT SZ9 CEM KNEE (Femur) IMPLANT
BAG COUNTER SPONGE SURGICOUNT (BAG) ×1 IMPLANT
BAG DECANTER FOR FLEXI CONT (MISCELLANEOUS) ×1 IMPLANT
BAG ZIPLOCK 12X15 (MISCELLANEOUS) ×1 IMPLANT
BASE TIBIAL ROT PLAT SZ 10 KNE (Miscellaneous) IMPLANT
BLADE SAGITTAL 25.0X1.19X90 (BLADE) ×1 IMPLANT
BLADE SAW SGTL 11.0X1.19X90.0M (BLADE) ×1 IMPLANT
BLADE SURG SZ10 CARB STEEL (BLADE) ×1 IMPLANT
BNDG ELASTIC 4INX 5YD STR LF (GAUZE/BANDAGES/DRESSINGS) IMPLANT
BNDG ELASTIC 6INX 5YD STR LF (GAUZE/BANDAGES/DRESSINGS) ×1 IMPLANT
BOOTIES KNEE HIGH SLOAN (MISCELLANEOUS) ×1 IMPLANT
BOWL SMART MIX CTS (DISPOSABLE) ×1 IMPLANT
CEMENT HV SMART SET (Cement) ×2 IMPLANT
COVER SURGICAL LIGHT HANDLE (MISCELLANEOUS) ×1 IMPLANT
CUFF TRNQT CYL 34X4.125X (TOURNIQUET CUFF) ×1 IMPLANT
DRAPE TOP 10253 STERILE (DRAPES) ×1 IMPLANT
DRAPE U-SHAPE 47X51 STRL (DRAPES) ×1 IMPLANT
DRSG AQUACEL AG ADV 3.5X10 (GAUZE/BANDAGES/DRESSINGS) ×1 IMPLANT
DURAPREP 26ML APPLICATOR (WOUND CARE) ×2 IMPLANT
ELECT PENCIL ROCKER SW 15FT (MISCELLANEOUS) ×1 IMPLANT
ELECT REM PT RETURN 15FT ADLT (MISCELLANEOUS) ×1 IMPLANT
GLOVE BIO SURGEON STRL SZ8 (GLOVE) ×2 IMPLANT
GLOVE BIOGEL PI IND STRL 7.0 (GLOVE) ×1 IMPLANT
GLOVE BIOGEL PI IND STRL 8 (GLOVE) ×2 IMPLANT
GLOVE SURG SYN 7.0 PF PI (GLOVE) ×1 IMPLANT
GOWN SRG XL LVL 4 BRTHBL STRL (GOWNS) ×1 IMPLANT
GOWN STRL REUS W/ TWL XL LVL3 (GOWN DISPOSABLE) ×2 IMPLANT
HOLDER FOLEY CATH W/STRAP (MISCELLANEOUS) IMPLANT
HOOD PEEL AWAY T7 (MISCELLANEOUS) ×3 IMPLANT
INSERT TIBIAL ATTUNE SZ9 5MM (Insert) IMPLANT
KIT TURNOVER KIT A (KITS) ×1 IMPLANT
MANIFOLD NEPTUNE II (INSTRUMENTS) ×1 IMPLANT
NS IRRIG 1000ML POUR BTL (IV SOLUTION) ×1 IMPLANT
PACK TOTAL KNEE CUSTOM (KITS) ×1 IMPLANT
PAD ARMBOARD POSITIONER FOAM (MISCELLANEOUS) ×1 IMPLANT
PIN STEINMAN FIXATION KNEE (PIN) IMPLANT
PROTECTOR NERVE ULNAR (MISCELLANEOUS) ×1 IMPLANT
SET HNDPC FAN SPRY TIP SCT (DISPOSABLE) ×1 IMPLANT
SPIKE FLUID TRANSFER (MISCELLANEOUS) ×2 IMPLANT
SUT ETHIBOND NAB CT1 #1 30IN (SUTURE) ×1 IMPLANT
SUT VIC AB 0 CT1 36 (SUTURE) ×1 IMPLANT
SUT VIC AB 2-0 CT1 TAPERPNT 27 (SUTURE) ×1 IMPLANT
SUT VICRYL AB 3-0 FS1 BRD 27IN (SUTURE) ×1 IMPLANT
SUTURE STRATFX 0 PDS 27 VIOLET (SUTURE) ×1 IMPLANT
TRAY FOLEY MTR SLVR 16FR STAT (SET/KITS/TRAYS/PACK) IMPLANT
WATER STERILE IRR 1000ML POUR (IV SOLUTION) ×2 IMPLANT
WRAP KNEE MAXI GEL POST OP (GAUZE/BANDAGES/DRESSINGS) ×1 IMPLANT
YANKAUER SUCT BULB TIP NO VENT (SUCTIONS) ×1 IMPLANT

## 2024-09-12 NOTE — Evaluation (Signed)
 Physical Therapy Evaluation Patient Details Name: John Frye MRN: [id number] DOB: 1956/03/29 Today's Date: 09/12/2024  History of Present Illness  68 yo male presents to therapy s/p R TKA on 09/12/2024 due to failure of conservative measures. Pt PMH includes but is not limited to: OA, DM II, graves dz, LBP, OSA, hypothyroidism, and B hip surgery.  Clinical Impression      John Frye is a 68 y.o. male POD 0 s/p R TKA. Patient reports IND with mobility at baseline. Patient is now limited by functional impairments (see PT problem list below) and requires CGA for transfers and gait with RW. Patient was able to ambulate 60 feet with RW and CGA and cues for safe walker management. Patient educated on safe sequencing for stair mobility with one UE support and SPC, fall risk prevention, slowly increasing activity level, use of CP/ice, pain management and goal and car transfer pt and spouse verbalized understanding of safe guarding position for people assisting with mobility. Patient instructed in exercises to facilitate ROM and circulation reviewed and HO provided. Patient will benefit from continued skilled PT interventions to address impairments and progress towards PLOF. Patient has met mobility goals at adequate level for discharge home with family support and OPPT services; will continue to follow if pt continues acute stay to progress towards Mod I goals.     If plan is discharge home, recommend the following: A little help with walking and/or transfers;A little help with bathing/dressing/bathroom;Assistance with cooking/housework;Assist for transportation;Help with stairs or ramp for entrance   Can travel by private vehicle        Equipment Recommendations None recommended by PT  Recommendations for Other Services       Functional Status Assessment Patient has had a recent decline in their functional status and demonstrates the ability to make significant improvements in function in  a reasonable and predictable amount of time.     Precautions / Restrictions Precautions Precautions: Knee;Fall Restrictions Weight Bearing Restrictions Per Provider Order: Yes      Mobility  Bed Mobility Overal bed mobility: Needs Assistance Bed Mobility: Supine to Sit     Supine to sit: Supervision     General bed mobility comments: min cues, HOB slightly elevated    Transfers Overall transfer level: Needs assistance Equipment used: Rolling walker (2 wheels) Transfers: Sit to/from Stand Sit to Stand: Contact guard assist           General transfer comment: min cues    Ambulation/Gait Ambulation/Gait assistance: Contact guard assist Gait Distance (Feet): 55 Feet Assistive device: Rolling walker (2 wheels) Gait Pattern/deviations: Step-to pattern, Decreased stance time - right, Antalgic Gait velocity: decreased     General Gait Details: slight trunk flexion wtih B UE support on RW to offload R LE in stance phase, min cues for RW management  Stairs Stairs: Yes Stairs assistance: Contact guard assist Stair Management: Two rails Number of Stairs: 2 General stair comments: B handrails initally with step to pattern and cues for safety and technique and pt able to complete trials with use of R UE SPC and L UE support as if door frame and pt instructed on use of RW and retrograde stepping pattern  Wheelchair Mobility     Tilt Bed    Modified Rankin (Stroke Patients Only)       Balance Overall balance assessment: Needs assistance Sitting-balance support: Feet supported Sitting balance-Leahy Scale: Good     Standing balance support: Bilateral upper extremity supported, During functional activity, Reliant on  assistive device for balance Standing balance-Leahy Scale: Poor                               Pertinent Vitals/Pain Pain Assessment Pain Assessment: 0-10 Pain Score: 3  Pain Location: R knee and LE (back pain) Pain Descriptors /  Indicators: Aching, Constant, Discomfort, Dull, Grimacing, Operative site guarding Pain Intervention(s): Limited activity within patient's tolerance, Monitored during session, Premedicated before session, Repositioned, Ice applied    Home Living Family/patient expects to be discharged to:: Private residence Living Arrangements: Spouse/significant other Available Help at Discharge: Family Type of Home: House Home Access: Stairs to enter Entrance Stairs-Rails: None Entrance Stairs-Number of Steps: 2 Alternate Level Stairs-Number of Steps: flight Home Layout: Two level;1/2 bath on main level;Bed/bath upstairs;Able to live on main level with bedroom/bathroom Home Equipment: Rolling Walker (2 wheels);Cane - single point;Crutches      Prior Function Prior Level of Function : Independent/Modified Independent;Driving             Mobility Comments: IND no AD for all ADL self care tasks and IADLs       Extremity/Trunk Assessment        Lower Extremity Assessment Lower Extremity Assessment: RLE deficits/detail RLE Deficits / Details: ankle DF/PF 5/5; SLR < 10 degree lag RLE Sensation: decreased light touch (once in standng B dorsal surface of feet)    Cervical / Trunk Assessment Cervical / Trunk Assessment: Normal  Communication   Communication Communication: No apparent difficulties    Cognition Arousal: Alert Behavior During Therapy: WFL for tasks assessed/performed   PT - Cognitive impairments: No apparent impairments                         Following commands: Intact       Cueing       General Comments      Exercises Total Joint Exercises Ankle Circles/Pumps: AROM, Both, 10 reps Quad Sets: AROM, Right, 5 reps Short Arc Quad: AROM, Right, 5 reps Heel Slides: AROM, Right, 5 reps Hip ABduction/ADduction: AROM, Right, 5 reps Straight Leg Raises: AROM, Right, 5 reps Knee Flexion: AROM, Right, 5 reps, Seated   Assessment/Plan    PT Assessment  Patient needs continued PT services  PT Problem List Decreased strength;Decreased range of motion;Decreased activity tolerance;Decreased balance;Decreased mobility;Decreased coordination;Decreased cognition;Pain       PT Treatment Interventions DME instruction;Gait training;Stair training;Functional mobility training;Therapeutic activities;Therapeutic exercise;Balance training;Neuromuscular re-education;Patient/family education;Modalities    PT Goals (Current goals can be found in the Care Plan section)  Acute Rehab PT Goals Patient Stated Goal: to be able to be more active, no pain and ride the bike again PT Goal Formulation: With patient Time For Goal Achievement: 09/26/24    Frequency 7X/week     Co-evaluation               AM-PAC PT 6 Clicks Mobility  Outcome Measure Help needed turning from your back to your side while in a flat bed without using bedrails?: None Help needed moving from lying on your back to sitting on the side of a flat bed without using bedrails?: A Little Help needed moving to and from a bed to a chair (including a wheelchair)?: A Little Help needed standing up from a chair using your arms (e.g., wheelchair or bedside chair)?: A Little Help needed to walk in hospital room?: A Little Help needed climbing 3-5 steps with a railing? :  A Little 6 Click Score: 19    End of Session Equipment Utilized During Treatment: Gait belt Activity Tolerance: Patient tolerated treatment well;No increased pain Patient left: in chair;with call bell/phone within reach;with family/visitor present Nurse Communication: Mobility status;Other (comment) (pt readiness for same day d/c) PT Visit Diagnosis: Unsteadiness on feet (R26.81);Other abnormalities of gait and mobility (R26.89);Muscle weakness (generalized) (M62.81);Difficulty in walking, not elsewhere classified (R26.2);Pain Pain - Right/Left: Right Pain - part of body: Knee;Leg    Time: 8852-8763 PT Time Calculation  (min) (ACUTE ONLY): 49 min   Charges:   PT Evaluation $PT Eval Low Complexity: 1 Low PT Treatments $Gait Training: 8-22 mins $Therapeutic Exercise: 8-22 mins PT General Charges $$ ACUTE PT VISIT: 1 Visit         [place], PT Acute Rehab   [place] VEAR Drone 09/12/2024, 3:32 PM

## 2024-09-12 NOTE — Op Note (Signed)
 PREOP DIAGNOSIS: DJD RIGHT KNEE POSTOP DIAGNOSIS: same PROCEDURE: RIGHT TKR ANESTHESIA: Spinal and MAC supplemented with general ATTENDING SURGEON: Maude KANDICE [place] ASSISTANT: Prentice Earl PA  INDICATIONS FOR PROCEDURE: IAlexander Frye is a 68 y.o. male who has struggled for a long time with pain due to degenerative arthritis of the right knee.  The patient has failed many conservative non-operative measures and at this point has pain which limits the ability to sleep and walk.  The patient is offered total knee replacement.  Informed operative consent was obtained after discussion of possible risks of anesthesia, infection, neurovascular injury, DVT, and death.  The importance of the post-operative rehabilitation protocol to optimize result was stressed extensively with the patient.  SUMMARY OF FINDINGS AND PROCEDURE:  John Frye was taken to the operative suite where under the above anesthesia a right knee replacement was performed.  There were advanced degenerative changes and the bone quality was excellent.  We used the DePuy Attune system and placed size 9 femur, 10 tibia, 5 mm all polyethylene patella, and a size 41 mm spacer.  Prentice Earl PA-C assisted throughout and was invaluable to the completion of the case in that he helped retract and maintain exposure while I placed components.  He also helped close thereby minimizing OR time.  The patient was admitted for appropriate post-op care to include perioperative antibiotics and mechanical and pharmacologic measures for DVT prophylaxis.  DESCRIPTION OF PROCEDURE:  John Frye was taken to the operative suite where the above anesthesia was applied.  The patient was positioned supine and prepped and draped in normal sterile fashion.  An appropriate time out was performed.  After the administration of kefzol  pre-op antibiotic the leg was elevated and exsanguinated and a tourniquet inflated. A standard longitudinal incision was made on the  anterior knee.  Dissection was carried Frye to the extensor mechanism.  All appropriate anti-infective measures were used including the pre-operative antibiotic, betadine  impregnated drape, and closed hooded exhaust systems for each member of the surgical team.  A medial parapatellar incision was made in the extensor mechanism and the knee cap flipped and the knee flexed.  Some residual meniscal tissues were removed along with any remaining ACL/PCL tissue.  A guide was placed on the tibia and a flat cut was made on it's superior surface.  An intramedullary guide was placed in the femur and was utilized to make anterior and posterior cuts creating an appropriate flexion gap.  A second intramedullary guide was placed in the femur to make a distal cut properly balancing the knee with an extension gap equal to the flexion gap.  The three bones sized to the above mentioned sizes and the appropriate guides were placed and utilized.  A trial reduction was done and the knee easily came to full extension and the patella tracked well on flexion.  The trial components were removed and all bones were cleaned with pulsatile lavage and then dried thoroughly.  Cement was mixed and was pressurized onto the bones followed by placement of the aforementioned components.  Excess cement was trimmed and pressure was held on the components until the cement had hardened.  The tourniquet was deflated and a small amount of bleeding was controlled with cautery and pressure.  The knee was irrigated thoroughly.  The extensor mechanism was re-approximated with #1 ethibond in interrupted fashion.  The knee was flexed and the repair was solid.  The subcutaneous tissues were re-approximated with #0 and #2-0 vicryl and the skin closed with  a subcuticular stitch and steristrips.  A sterile dressing was applied.  Intraoperative fluids, EBL, and tourniquet time can be obtained from anesthesia records.  DISPOSITION:  The patient was taken to recovery  room in stable condition and scheduled to potentially go home same day depending on ability to walk and tolerate liquids..  John Frye John Frye 09/12/2024, 9:14 AM

## 2024-09-12 NOTE — Anesthesia Procedure Notes (Signed)
 Anesthesia Regional Block: Adductor canal block   Pre-Anesthetic Checklist: , timeout performed,  Correct Patient, Correct Site, Correct Laterality,  Correct Procedure, Correct Position, site marked,  Risks and benefits discussed,  Surgical consent,  Pre-op evaluation,  At surgeon's request and post-op pain management  Laterality: Right  Prep: chloraprep       Needles:  Injection technique: Single-shot  Needle Type: Echogenic Needle     Needle Length: 9cm  Needle Gauge: 21     Additional Needles:   Procedures:,,,, ultrasound used (permanent image in chart),,    Narrative:  Start time: 09/12/2024 6:56 AM End time: 09/12/2024 7:04 AM Injection made incrementally with aspirations every 5 mL.  Performed by: Personally  Anesthesiologist: Corinne Garnette BRAVO, MD  Additional Notes: No pain on injection. No increased resistance to injection. Injection made in 5cc increments.  Good needle visualization.  Patient tolerated procedure well.

## 2024-09-12 NOTE — Addendum Note (Signed)
 Addendum  created 09/12/24 1442 by Corinne Garnette BRAVO, MD   Clinical Note Signed

## 2024-09-12 NOTE — Transfer of Care (Signed)
 Immediate Anesthesia Transfer of Care Note  Patient: John Frye  Procedure(s) Performed: ARTHROPLASTY, KNEE, TOTAL (Right: Knee)  Patient Location: PACU  Anesthesia Type:General  Level of Consciousness: awake, alert , oriented, and patient cooperative  Airway & Oxygen Therapy: Patient Spontanous Breathing  Post-op Assessment: Report given to RN and Post -op Vital signs reviewed and stable  Post vital signs: Reviewed and stable  Last Vitals:  Vitals Value Taken Time  BP 126/84 09/12/24 09:39  Temp    Pulse 62 09/12/24 09:41  Resp 15 09/12/24 09:41  SpO2 100 % on RA 09/12/24 09:41  Vitals shown include unfiled device data.  Last Pain:  Vitals:   09/12/24 0623  TempSrc:   PainSc: 0-No pain         Complications: No notable events documented.

## 2024-09-12 NOTE — Anesthesia Procedure Notes (Signed)
 Procedure Name: LMA Insertion Date/Time: 09/12/2024 8:12 AM  Performed by: Augusta Daved SAILOR, CRNAPre-anesthesia Checklist: Patient identified, Emergency Drugs available, Suction available and Patient being monitored Patient Re-evaluated:Patient Re-evaluated prior to induction Oxygen Delivery Method: Circle System Utilized Preoxygenation: Pre-oxygenation with 100% oxygen Induction Type: IV induction LMA: LMA inserted LMA Size: 4.0 Number of attempts: 1 Placement Confirmation: positive ETCO2 Tube secured with: Tape Dental Injury: Teeth and Oropharynx as per pre-operative assessment

## 2024-09-12 NOTE — Interval H&P Note (Signed)
 History and Physical Interval Note:  09/12/2024 7:26 AM  John Frye  has presented today for surgery, with the diagnosis of RIGHT KNEE DEGENERATIVE JOINT DISEASE.  The various methods of treatment have been discussed with the patient and family. After consideration of risks, benefits and other options for treatment, the patient has consented to  Procedure(s): ARTHROPLASTY, KNEE, TOTAL (Right) as a surgical intervention.  The patient's history has been reviewed, patient examined, no change in status, stable for surgery.  I have reviewed the patient's chart and labs.  Questions were answered to the patient's satisfaction.     Andrina Locken G [place] Kuehnle

## 2024-09-12 NOTE — Anesthesia Procedure Notes (Signed)
 Spinal  Patient location during procedure: OR Start time: 09/12/2024 7:39 AM End time: 09/12/2024 7:42 AM Reason for block: surgical anesthesia Staffing Performed: anesthesiologist  Anesthesiologist: Corinne Garnette BRAVO, MD Performed by: Corinne Garnette BRAVO, MD Authorized by: Corinne Garnette BRAVO, MD   Preanesthetic Checklist Completed: patient identified, IV checked, risks and benefits discussed, surgical consent, monitors and equipment checked, pre-op evaluation and timeout performed Spinal Block Patient position: sitting Prep: DuraPrep and site prepped and draped Patient monitoring: continuous pulse ox and blood pressure Approach: midline Location: L3-4 Injection technique: single-shot Needle Needle type: Pencan  Needle gauge: 24 G Assessment Events: CSF return Additional Notes Functioning IV was confirmed and monitors were applied. Sterile prep and drape, including hand hygiene, mask and sterile gloves were used. The patient was positioned and the spine was prepped. The skin was anesthetized with lidocaine.  Free flow of clear CSF was obtained prior to injecting local anesthetic into the CSF.  The spinal needle aspirated freely following injection.  The needle was carefully withdrawn.  The patient tolerated the procedure well. Consent was obtained prior to procedure with all questions answered and concerns addressed. Risks including but not limited to bleeding, infection, nerve damage, paralysis, failed block, inadequate analgesia, allergic reaction, high spinal, itching and headache were discussed and the patient wished to proceed.   Garnette Corinne, MD

## 2024-09-12 NOTE — Anesthesia Postprocedure Evaluation (Addendum)
 Anesthesia Post Note  Patient: John Frye  Procedure(s) Performed: ARTHROPLASTY, KNEE, TOTAL (Right: Knee)     Patient location during evaluation: PACU Anesthesia Type: Combined General/Spinal Level of consciousness: awake and alert Pain management: pain level controlled Vital Signs Assessment: post-procedure vital signs reviewed and stable Respiratory status: spontaneous breathing, nonlabored ventilation, respiratory function stable and patient connected to nasal cannula oxygen Cardiovascular status: blood pressure returned to baseline and stable Postop Assessment: no apparent nausea or vomiting, spinal receding, no headache and no backache Anesthetic complications: no   No notable events documented.  Last Vitals:  Vitals:   09/12/24 1030 09/12/24 1100  BP: (!) 136/58 119/80  Pulse: 61 (!) 58  Resp: 19 19  Temp:  36.6 C  SpO2: 100% 100%    Last Pain:  Vitals:   09/12/24 1403  TempSrc:   PainSc: 6                  Garnette FORBES [place]

## 2024-09-13 ENCOUNTER — Encounter (HOSPITAL_COMMUNITY): Payer: Self-pay | Admitting: Orthopaedic Surgery

## 2024-10-13 ENCOUNTER — Other Ambulatory Visit: Payer: Self-pay | Admitting: Family Medicine

## 2024-10-15 ENCOUNTER — Other Ambulatory Visit: Payer: Self-pay | Admitting: Family Medicine

## 2024-10-30 NOTE — Progress Notes (Signed)
" ° °  10/30/2024  Patient ID: John Frye, male   DOB: 1956/02/04, 69 y.o.   MRN: [id number]  Pharmacy Quality Measure Review  This patient is appearing on the insurance-providing list for being at risk of failing the adherence measure for Statin Use in Persons with Diabetes (SUPD) medications this calendar year.  Filled crestor  10/13/24 for 90ds.  [name], PharmD Clinical Pharmacist [phone]   "
